# Patient Record
Sex: Female | Born: 1935
Health system: Southern US, Community
[De-identification: ages and names within clinical notes are randomized; demographics above are authoritative.]

## PROBLEM LIST (undated history)

## (undated) DIAGNOSIS — R51 Headache: Secondary | ICD-10-CM

## (undated) DIAGNOSIS — M1990 Unspecified osteoarthritis, unspecified site: Secondary | ICD-10-CM

## (undated) DIAGNOSIS — N189 Chronic kidney disease, unspecified: Secondary | ICD-10-CM

## (undated) DIAGNOSIS — I10 Essential (primary) hypertension: Secondary | ICD-10-CM

## (undated) DIAGNOSIS — M25473 Effusion, unspecified ankle: Secondary | ICD-10-CM

## (undated) DIAGNOSIS — E7800 Pure hypercholesterolemia, unspecified: Secondary | ICD-10-CM

## (undated) HISTORY — PX: ABDOMINAL HYSTERECTOMY: SHX81

## (undated) HISTORY — PX: EYE SURGERY: SHX253

## (undated) HISTORY — PX: OTHER SURGICAL HISTORY: SHX169

---

## 2006-01-17 HISTORY — PX: ANKLE SURGERY: SHX546

## 2006-05-30 ENCOUNTER — Encounter: Admission: RE | Admit: 2006-05-30 | Discharge: 2006-05-30 | Payer: Self-pay | Admitting: Family Medicine

## 2007-03-15 ENCOUNTER — Encounter: Payer: Self-pay | Admitting: Orthopedic Surgery

## 2007-03-15 ENCOUNTER — Inpatient Hospital Stay (HOSPITAL_COMMUNITY): Admission: EM | Admit: 2007-03-15 | Discharge: 2007-03-18 | Payer: Self-pay | Admitting: Emergency Medicine

## 2007-03-15 IMAGING — CR DG ANKLE PORT 2V*L*
2 series · 2 of 2 positions shown · non-contrast
Comparison: [DATE], [DATE] hours.

Left ankle, 2 views, [DATE], [B4] hours.
INDICATION: Postreduction, fracture.

[AP]
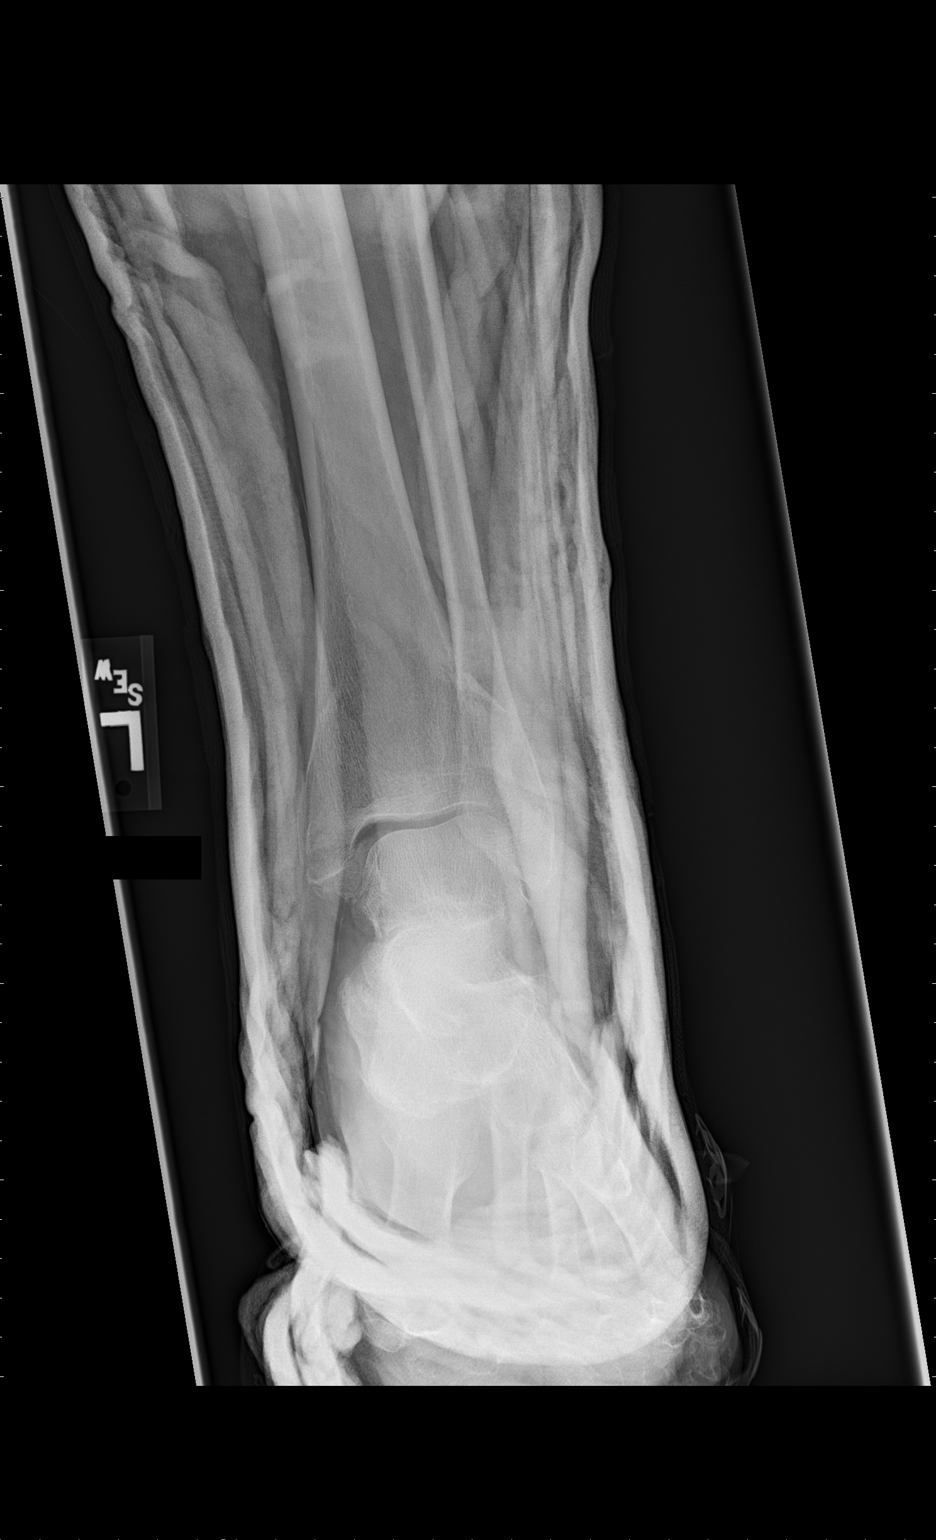

[ankle lat]
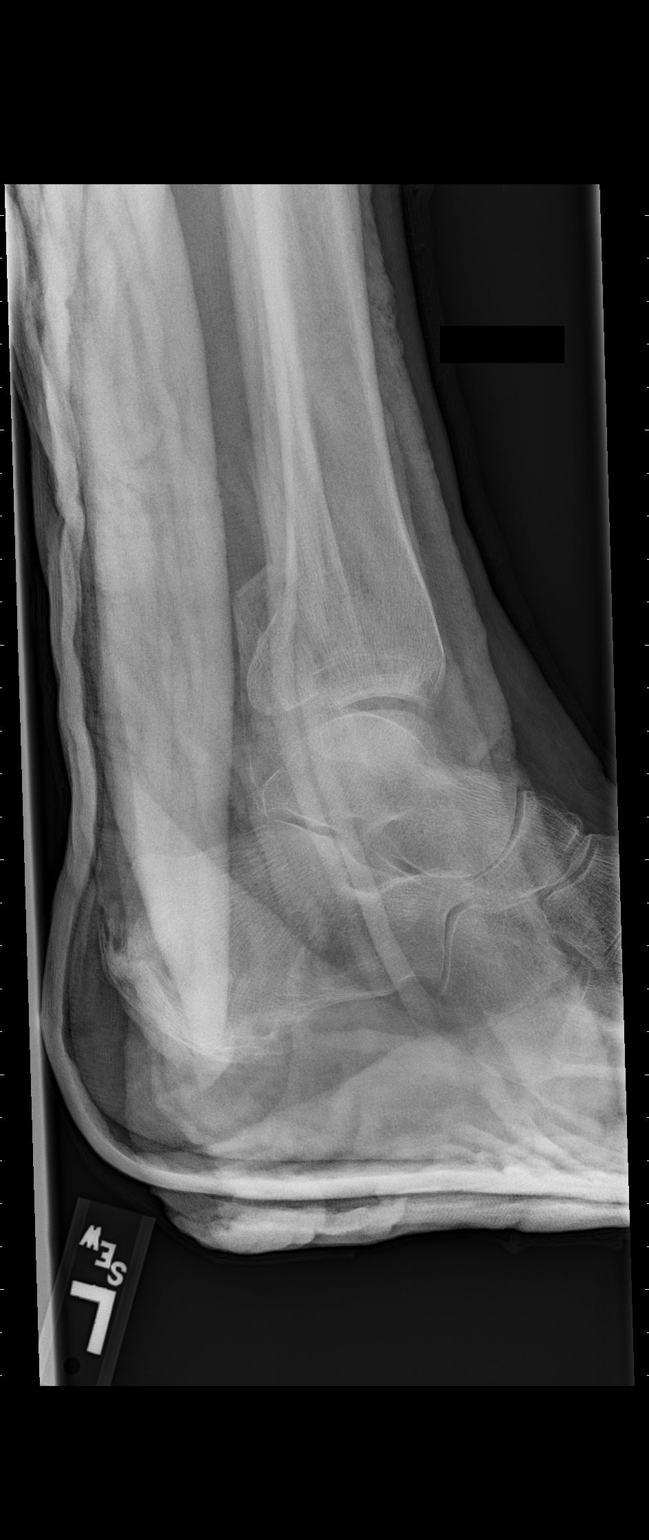

[2 of 2 positions shown; findings below may reference images not displayed]

FINDINGS: Splint has been applied to left ankle trimalleolar
fracture-dislocation. The ankle joint has been reduced, and alignment is
improved. Still one shaft width of posterior displacement of the lateral
malleolus. Cast material obscures bony detail.
IMPRESSION: 1. Reduction of trimalleolar fracture-dislocation.

## 2008-09-11 ENCOUNTER — Encounter (INDEPENDENT_AMBULATORY_CARE_PROVIDER_SITE_OTHER): Payer: Self-pay | Admitting: Obstetrics and Gynecology

## 2008-09-11 ENCOUNTER — Inpatient Hospital Stay (HOSPITAL_COMMUNITY): Admission: RE | Admit: 2008-09-11 | Discharge: 2008-09-13 | Payer: Self-pay | Admitting: Obstetrics and Gynecology

## 2010-04-24 LAB — URINALYSIS, ROUTINE W REFLEX MICROSCOPIC
Bilirubin Urine: NEGATIVE
Glucose, UA: NEGATIVE mg/dL
Ketones, ur: NEGATIVE mg/dL
pH: 6.5 (ref 5.0–8.0)

## 2010-04-24 LAB — CBC
Hemoglobin: 13.1 g/dL (ref 12.0–15.0)
Hemoglobin: 13.3 g/dL (ref 12.0–15.0)
MCHC: 34.8 g/dL (ref 30.0–36.0)
MCHC: 35.3 g/dL (ref 30.0–36.0)
MCV: 96.2 fL (ref 78.0–100.0)
RBC: 3.98 MIL/uL (ref 3.87–5.11)
RDW: 12 % (ref 11.5–15.5)
RDW: 12.1 % (ref 11.5–15.5)
WBC: 9.2 10*3/uL (ref 4.0–10.5)

## 2010-04-24 LAB — COMPREHENSIVE METABOLIC PANEL
ALT: 21 U/L (ref 0–35)
AST: 22 U/L (ref 0–37)
CO2: 30 mEq/L (ref 19–32)
Chloride: 102 mEq/L (ref 96–112)
Creatinine, Ser: 0.55 mg/dL (ref 0.4–1.2)
GFR calc Af Amer: >60 mL/min (ref >60)
GFR calc non Af Amer: >60 mL/min (ref >60)
Glucose, Bld: 119 mg/dL — ABNORMAL HIGH (ref 70–99)
Total Bilirubin: 0.5 mg/dL (ref 0.3–1.2)

## 2010-04-24 LAB — GLUCOSE, CAPILLARY
Glucose-Capillary: 118 mg/dL — ABNORMAL HIGH (ref 70–99)
Glucose-Capillary: 124 mg/dL — ABNORMAL HIGH (ref 70–99)
Glucose-Capillary: 141 mg/dL — ABNORMAL HIGH (ref 70–99)
Glucose-Capillary: 146 mg/dL — ABNORMAL HIGH (ref 70–99)

## 2010-04-24 LAB — APTT: aPTT: 28 seconds (ref 24–37)

## 2010-04-24 LAB — HEMOGLOBIN AND HEMATOCRIT, BLOOD: HCT: 40.1 % (ref 36.0–46.0)

## 2010-04-26 LAB — COMPREHENSIVE METABOLIC PANEL
ALT: 17 U/L (ref 0–35)
AST: 19 U/L (ref 0–37)
Albumin: 4.4 g/dL (ref 3.5–5.2)
CO2: 29 mEq/L (ref 19–32)
Calcium: 9.5 mg/dL (ref 8.4–10.5)
GFR calc Af Amer: >60 mL/min (ref >60)
GFR calc non Af Amer: >60 mL/min (ref >60)
Sodium: 135 mEq/L (ref 135–145)

## 2010-04-26 LAB — CBC
MCHC: 35.4 g/dL (ref 30.0–36.0)
RBC: 4.16 MIL/uL (ref 3.87–5.11)
WBC: 10.8 10*3/uL — ABNORMAL HIGH (ref 4.0–10.5)

## 2010-04-26 LAB — APTT: aPTT: 29 seconds (ref 24–37)

## 2010-04-26 LAB — URINALYSIS, ROUTINE W REFLEX MICROSCOPIC
Glucose, UA: NEGATIVE mg/dL
Ketones, ur: NEGATIVE mg/dL
Nitrite: NEGATIVE
Protein, ur: NEGATIVE mg/dL
pH: 7 (ref 5.0–8.0)

## 2010-04-26 LAB — URINE MICROSCOPIC-ADD ON

## 2010-06-01 NOTE — Op Note (Signed)
[name], BARBE NO.:  [id number]   MEDICAL RECORD NO.:  [id number]          PATIENT TYPE:  AMB   LOCATION:  DFTL                         FACILITY:  [facility]   PHYSICIAN:  Burnard Bunting, M.D.    DATE OF BIRTH:  Sep 30, 1934   DATE OF PROCEDURE:  03/15/2007  DATE OF DISCHARGE:                               OPERATIVE REPORT   PREOPERATIVE DIAGNOSES:  Left-ankle trimalleolar fracture.   POSTOPERATIVE DIAGNOSES:  Left-ankle trimalleolar fracture.   PROCEDURE:  Left ankle trimalleolar fracture open reduction and internal  fixation.   SURGEON:  Burnard Bunting, M.D.   ASSISTANT:  None.   ANESTHESIA:  General endotracheal.   ESTIMATED BLOOD LOSS:  50 mL.   DRAINS:  None.   INDICATIONS:  Erica Key is a 76 year old female with trimalleolar  ankle fracture on the left.  She presents now for operative management.  She is ambulatory.  She understands the risks and benefits.   PROCEDURE IN DETAIL:  Patient was brought to the operating room where  general endotracheal anesthesia was induced, preoperative antibiotic  administered.  The left leg was prepped and draped and positioned in a  sterile manner.  Collier Flowers was used to cover the operative field.  Ankle  Esmarch was utilized for approximately 60 minutes.   Lateral incision was made.  Skin and subcutaneous tissue were sharply  divided.  Care was taken to avoid an injury to the superficial peroneal  nerve.  Bone quality was poor.  Fracture was reduced with an alligator  clip.  Lag screw was placed.  Reasonable purchase was obtained.  Side  plate was applied and a combination of locking screws was applied, as  well as cortical screws.  Anatomic restoration of fibular length and  rotation was restored.  This incision was irrigated and packed with a  moist cloth.  Medial incision was then made over the fracture site.  Skin and subcutaneous tissue were sharply divided.  Care was taken to  avoid injury to the saphenous  vein.  Fracture was reduced under direct  visualization and held in position with a 4.0 cannulated screw, which  was checked in the AP and lateral planes under fluoroscopy.  At this  time, ankle Esmarch was released.  Bleeding points encountered were  cauterized with electrocautery.  Thorough irrigation was performed.  The  syndesmosis was stressed and the  syndesmosis was found to be stable.  All incisions were then closed,  using interrupted 2-0 Vicryl sutures, 3-0 Vicryl suture and 3-0 nylon.  Bulky dressing and posterior splint were applied.  The patient tolerated  the procedure well, without immediate complications.      Burnard Bunting, M.D.  Electronically Signed     GSD/MEDQ  D:  03/15/2007  T:  03/16/2007  Job:  [id number]

## 2010-06-01 NOTE — Consult Note (Signed)
NAMEKIMMARIE, PASCALE NO.:  [id number]   MEDICAL RECORD NO.:  [id number]          PATIENT TYPE:  EMS   LOCATION:  MAJO                         FACILITY:  [facility]   PHYSICIAN:  Burnard Bunting, M.D.    DATE OF BIRTH:  1934-06-18   DATE OF CONSULTATION:  03/15/2007  DATE OF DISCHARGE:                                 CONSULTATION   CHIEF COMPLAINT:  Left flank pain.   HISTORY OF PRESENT ILLNESS:  Erica Key is a 76 year old female who was  walking home from church when she missed a step and fell and injured her  left ankle.  She denies any loss of consciousness or any other  orthopedic complaints.  She is unable to bear weight.  She had a  deformity as well as significant pain upon arrival to the ER.   PAST MEDICAL HISTORY:  1. Hypertension.  2. Diabetes.   PAST SURGICAL HISTORY:  No prior surgeries.   SOCIAL HISTORY:  The patient does not smoke or drink.  No drug abuse.  She is retired.  She lives with her husband and family here in  [place].   ALLERGIES:  NO KNOWN DRUG ALLERGIES.   CURRENT MEDICATIONS:  1. Aspirin 81 mg p.o. daily.  2. Metoprolol 100 mg p.o. daily.  3. Diovan 1 p.o. daily.  4. Metformin 500 mg p.o. daily.  5. Clonidine 0.1 mg p.o. twice a day.   REVIEW OF SYSTEMS:  14 systems reviewed and negative.  No recent fever,  chills, chest pain, shortness of breath.   PHYSICAL EXAMINATION:  VITAL SIGNS:  Blood pressure currently 135/76,  heart rate 76, respiratory rate 20, temperature 98.  GENERAL:  She is really in no distress right now.  EXTREMITIES:  She has good cervical spine range of motion.  Pounding  radial pulses bilaterally.  Full range of motion of shoulders, elbows,  and wrists.  There are no masses, __________  , skin changes, or  lymphadenopathy noted in the upper extremity area.  Right lower  extremity demonstrates full range of motion of hip, ankle, and knees  with palpable pulse.  On the left, she has a splint on foot.  Her  toes  are profuse sensate and mobile.  Pedal pulses are palpable.  Arms are  soft.  Knee range of motion full.  ABDOMEN:  Benign.  CHEST:  Clear to auscultation.  NEUROLOGIC:  The patient is alert and oriented x3.   EKG shows normal sinus rhythm.  Chest x-ray with no airspace disease.  Ankle x-ray showed reduction of trimalleolar ankle fracture/dislocation.   LABORATORY DATA:  Creatinine 0.7, potassium 133.1, BUN 13, glucose 135.  White count 14.5, hemoglobin 13.8, platelets 291.   IMPRESSION:  Left ankle fracture/dislocation, now reduced, in a diabetic  patient.   PLAN:  The patient is ambulatory, and she will need open reduction and  internal fixation.  The risks and benefits were discussed with the  patient, including but not limited to, infection, nonunion, malunion,  deep venous thrombosis, death, as well as need for more surgery.  All  questions  were answered.      Burnard Bunting, M.D.  Electronically Signed     GSD/MEDQ  D:  03/15/2007  T:  03/15/2007  Job:  [id number]

## 2010-06-01 NOTE — Op Note (Signed)
NAMESILA, SARSFIELD NO.:  [id number]   MEDICAL RECORD NO.:  [id number]          PATIENT TYPE:  INP   LOCATION:  9319                          FACILITY:  [facility]   PHYSICIAN:  Miguel Aschoff, M.D.       DATE OF BIRTH:  Sep 29, 1934   DATE OF PROCEDURE:  09/11/2008  DATE OF DISCHARGE:                               OPERATIVE REPORT   PREOPERATIVE DIAGNOSIS:  Symptomatic pelvic relaxation with cystocele,  rectocele, and uterine descensus.   POSTOPERATIVE DIAGNOSIS:  Symptomatic pelvic relaxation with cystocele,  rectocele, and uterine descensus.   PROCEDURE:  Total vaginal hysterectomy, anterior posterior colporrhaphy.   SURGEON:  Miguel Aschoff, MD   ASSISTANT:  Luvenia Redden, MD   ANESTHESIA:  Spinal and epidural combined anesthesia.   COMPLICATIONS:  None.   JUSTIFICATION:  The patient is a 76 year old white female who is  complaining of pelvic pressure and pain and a vaginal bulge.  On  examination, she is noted to have a grade 3 cystocele, grade 2  rectocele, plus the uterine descensus.  Because of pressure and symptoms  associated with her pelvic relaxation and urinary stress incontinence,  she has requested that a definitive procedure be carried out to correct  these pelvic floor defects.  The risks and benefits of the procedure  were discussed with the patient.  Informed consent has been obtained.   PROCEDURE:  The patient was taken to the operating room, placed in a  sitting position, and a combination of the spinal and epidural  anesthetic was placed without difficulty.  After achieving satisfactory  level of anesthesia, the patient was placed in the dorsal lithotomy  position, prepped and draped in the usual sterile fashion.  Foley  catheter was inserted in the bladder.  At this point, examination again  revealed the grade 3 cystocele, grade 2 rectocele, and plus the uterine  descensus.  No other abnormalities were noted.  At this point, a  speculum was  placed in the vaginal vault.  The anterior cervical lip was  grasped with tenaculum.  The cervix was then injected with 1% Xylocaine  with epinephrine for hemostasis.  Then, the cervical mucosa was  circumscribed and dissected anteriorly and posteriorly.  Posteriorly,  there appeared to be some adhesive disease in the cul-de-sac, and the  dissection continued in anterior fashion staying in the uterus rather  than dissecting towards the rectum.  Anteriorly, the peritoneum was  entered without difficulty.  At this point, the uterosacral ligaments  were identified, clamped, cut, and suture ligated using suture ligatures  of 0 Vicryl.  The cardinal ligaments were clamped, cut, and suture  ligated in similar fashion using curved Heaney clamps.  Uterine vessels  were then found, clamped with curved Heaney clamps.  Pedicle cut and  suture ligated using suture ligatures of 0 Vicryl.  Then, additional  bites were taken of the broad ligament structures with curved Heaney  clamps.  Again, all pedicles were suture ligated using suture ligatures  of 0 Vicryl.  The glands appeared to be obliteration of cul-de-sac.  The  fundus was delivered.  The posterior surface of the uterus was then  identified, and with care the rectum was dissected off the posterior  surface of the lower uterine segment.  It appeared to be secondary to  endometriosis.  This was done with care to avoid any injury to the  rectum.  Once this was done, it was possible to remove the remaining  structures holding the uterus in place which was posterior and inferior  to the cervix and this was cut and ligated using ligature of 0 Vicryl.  Prior to this, the round ligaments and utero-ovarian ligaments were  clamped, cut, and suture ligated using suture ligatures of 0 Vicryl and  these were held.  At this point, the specimen was freed.  Inspection was  then made in the cul-de-sac and the remarkable finding appeared to be an  ovary that  again was adherent to the posterior cul-de-sac and lateral  pelvic sidewall by what appeared to be old scarred endometriosis.  At  this point, the posterior cuff was closed using running interlocking 0  Vicryl suture.  Then, lap counts were found to be correct and then the  peritoneum was closed using a pursestring suture of 0 Vicryl.  At this  point, attention was directed to the anterior vaginal wall.  Edges of  the vaginal mucosa were grasped with Allis clamps, and Allis clamp was  placed in midline below the urethra.  The anterior wall was then  injected with dilute 1% Xylocaine with epinephrine, and then dissection  was continued in the midline to approximately 2 cm of the urethral  orifice and then the vaginal mucosa was freed from the paravesical  fascia without difficulty.  Once this was done, because of mild  complaints of stress incontinence, urethra was elevated using a modified  Kelly plication using 2-0 Vicryl.  Once this was done, the large  cystocele was reduced using pursestring sutures of 2-0 Vicryl, three  such sutures were used.  Paravesical fascia was reapproximated in  midline using interrupted 2-0 Vicryl sutures.  The excess vaginal mucosa  was then trimmed and then the anterior walls were repaired by using  running interlocking 2-0 Vicryl suture incorporating the paravesical  fascia closing the dead space.  Once this was done, the remainder of the  vaginal cuff was closed using running continuous 2-0 Vicryl suture.  There was good hemostasis.  At this point, attention was directed to the  perineum.  The posterior vaginal wall was inspected.  Again, there was a  second-degree rectocele, was identified, grasped in the midline.  An  ellipse of mucosa was incised in the perineum, and dissection was  continued in the midline for approximately 6 cm.  At this point, the  pararectal fascia was dissected free from the vaginal mucosa.  The  rectocele was then reduced using  again 2 pursestring sutures of 2-0  Vicryl.  The pararectal fascia was reapproximated in the midline using  interrupted 0 Vicryl sutures.  At this point, the excess vaginal mucosa  was trimmed and vaginal mucosa was reapproximated using running  interlocking 2-0 Vicryl suture again incorporating some of the  pararectal fascia to close the dead space.  Perineal body was then  reconstituted using a crown suture of 0 Vicryl and then the perineum was  closed using subcutaneous 2-0 Vicryl running sutures, and then the  perineal skin was reapproximated using subcuticular 2-0 Vicryl sutures.  Final inspection was made for hemostasis.  Hemostasis appeared to  be  excellent.  An Iodoform pack was placed in the vagina and the procedure  was completed.  The estimated blood loss was approximately 100 mL.  The  patient tolerated the procedure well.  Her urine remained clear  throughout the procedure.  The patient was taken to recovery in  satisfactory condition.      Miguel Aschoff, M.D.  Electronically Signed     AR/MEDQ  D:  09/11/2008  T:  09/11/2008  Job:  [id number]

## 2010-06-04 NOTE — Discharge Summary (Signed)
NAMEBECKIE, VISCARDI NO.:  [id number]   MEDICAL RECORD NO.:  [id number]          PATIENT TYPE:  INP   LOCATION:  5013                         FACILITY:  [facility]   PHYSICIAN:  Burnard Bunting, M.D.    DATE OF BIRTH:  1934-10-24   DATE OF ADMISSION:  03/14/2007  DATE OF DISCHARGE:  03/18/2007                               DISCHARGE SUMMARY   DISCHARGE DIAGNOSIS:  Left ankle fracture.   SECONDARY DIAGNOSES:  1. Hypertension.  2. Diabetes.   OPERATIONS AND PROCEDURES:  Left ankle fracture open reduction and  internal fixation performed on March 15, 2007.   HOSPITAL COURSE:  Erica Key is an ambulatory 76 year old female who  sustained left ankle fracture and dislocation on March 15, 2007.  She  underwent open reduction and internal fixation of the fracture at that  time.  She tolerated the procedure well without any complications, foot  was perfused well and sensate on postoperative day #1.  She was started  on Coumadin for DVT prophylaxis and therapy for mobilization,  nonweightbearing on the ankle which was splinted.  The patient has had  otherwise unremarkable recovery.  She is discharged home in good  condition on March 18, 2007.  Nonweightbearing on the left ankle.   DISCHARGE MEDICATIONS:  1. Coumadin 5 mg p.o. daily with INR of 2 to 2.5.  2. Metoprolol 100 mg p.o. daily.  3. Diovan 1 p.o. daily.  4. Metformin 500 mg p.o. daily.  5. Clonidine 0.1 mg p.o. b.i.d.  6. Percocet 1-2 p.o. q.6 h. p.r.n. pain.  7. Robaxin 500 mg p.o. q.8 h. p.r.n. muscle spasm.   She will follow up with me within the 7 days for splint removal.  She  was discharged home in good condition.      Burnard Bunting, M.D.  Electronically Signed     GSD/MEDQ  D:  04/24/2007  T:  04/25/2007  Job:  [id number]

## 2010-06-04 NOTE — Discharge Summary (Signed)
Erica Key, Erica Key NO.:  [id number]   MEDICAL RECORD NO.:  [id number]          PATIENT TYPE:  INP   LOCATION:  9319                          FACILITY:  [facility]   PHYSICIAN:  Miguel Aschoff, M.D.       DATE OF BIRTH:  September 27, 1934   DATE OF ADMISSION:  09/11/2008  DATE OF DISCHARGE:  09/13/2008                               DISCHARGE SUMMARY   ADMISSION DIAGNOSES:  Symptomatic pelvic relaxation with cystocele,  rectocele, uterine descensus, urinary stress incontinence.   OPERATIONS AND PROCEDURES:  Total vaginal hysterectomy with anterior-  posterior colporrhaphy.  General anesthesia.   BRIEF HISTORY:  The patient is a 76 year old white female who presented  to the office for evaluation of lower abdominal pelvic pressure and pain  and noticing something protruding from the vagina.  On examination, the  patient was noted have grade 3 cystocele, grade 2 uterine descensus, and  grade 2 rectocele.  Because of the symptomatology associated with these  pelvic floor hernias, the patient had requested that a procedure be  carried out in order to correct these defects.  After receiving medical  clearance, the patient was admitted to the hospital on September 11, 2008  to undergo total vaginal hysterectomy and anterior and posterior  colporrhaphy.  Preoperative studies were obtained.  This include  admission hemoglobin of 13.3, white count of 9200.  PT and PTT were  within normal limits.  Chemistry profile was essentially within normal  limits.   HOSPITAL COURSE:  Under epidural and spinal anesthesia on September 11, 2008, a total vaginal hysterectomy, anterior and posterior colporrhaphy  were carried out.  The only significant finding other than these pelvic  floor defects was obliteration of cul-de-sac felt secondary to  endometriosis.  No other findings were of significance during this  surgery and this was dealt with via the surgical procedure be carefully  taking the uterine  fundus and dissecting the adhesions off the posterior  surface of the uterus with care to avoid any injury to the rectum.  The  procedure was carried out without difficulty.  The patient had an  essentially uneventful postoperative course.  The only remarkable  finding was initial nausea on the first postoperative day.  Her  hemoglobin remained stable in the 12 range.  The patient remained  afebrile.  After controlling her nausea with antiemetic therapy and IV  fluids, by the second postoperative day, the Foley catheter was removed,  and the patient was ambulating well and tolerating regular diet, and  felt to be in an condition to go home.  Medications for home include  Tylox one every 3 hours as needed for pain.  She was instructed to begin  all her other postoperative medications.  Additional instructions were  to place nothing in the vagina, to call if there are any problems such  as fever, pain, or heavy bleeding.  The final pathology report on  hysterectomy specimen revealed a 69 g uterus.  There was chronic  cervicitis and active endometrium.  Leiomyomas were found, the benign  portion of the ovary  noted that was removed at the time of dealing with  the cul-de-sac obliteration.  The patient was sent home on a regular  diet.  Plan is for the patient to be seen back in 4 weeks for followup  examination.  She understands to call for any problems such as fever,  pain, or heavy bleeding.      Miguel Aschoff, M.D.  Electronically Signed    AR/MEDQ  D:  09/16/2008  T:  09/17/2008  Job:  [id number]

## 2010-10-08 LAB — I-STAT 8, (EC8 V) (CONVERTED LAB)
Acid-Base Excess: 3 — ABNORMAL HIGH
BUN: 13
Bicarbonate: 29.4 — ABNORMAL HIGH
Chloride: 99
Glucose, Bld: 135 — ABNORMAL HIGH
HCT: 43
Hemoglobin: 14.6
Operator id: 133351
Potassium: 4.1
Sodium: 133 — ABNORMAL LOW
TCO2: 31
pCO2, Ven: 49.4
pH, Ven: 7.382 — ABNORMAL HIGH

## 2010-10-08 LAB — DIFFERENTIAL
Basophils Absolute: 0
Eosinophils Relative: 1
Lymphocytes Relative: 25
Lymphs Abs: 3.6
Monocytes Absolute: 0.5
Monocytes Relative: 4

## 2010-10-08 LAB — PROTIME-INR
INR: 1
Prothrombin Time: 14.6

## 2010-10-08 LAB — CBC
MCV: 93
RBC: 4.26
RDW: 12.4
WBC: 14.5 — ABNORMAL HIGH

## 2010-10-08 LAB — POCT I-STAT CREATININE
Creatinine, Ser: 0.7
Operator id: 133351

## 2012-03-14 ENCOUNTER — Other Ambulatory Visit: Payer: Self-pay | Admitting: Gastroenterology

## 2012-04-03 ENCOUNTER — Encounter (HOSPITAL_COMMUNITY): Payer: Self-pay | Admitting: *Deleted

## 2012-04-05 ENCOUNTER — Encounter (HOSPITAL_COMMUNITY): Payer: Self-pay | Admitting: Pharmacy Technician

## 2012-04-10 ENCOUNTER — Ambulatory Visit (HOSPITAL_COMMUNITY)
Admission: RE | Admit: 2012-04-10 | Discharge: 2012-04-10 | Disposition: A | Discharge status: Home / Self Care | Payer: Medicare Other | Source: Ambulatory Visit | Attending: Gastroenterology | Admitting: Gastroenterology

## 2012-04-10 ENCOUNTER — Encounter (HOSPITAL_COMMUNITY)
Admission: RE | Disposition: A | Discharge status: Home / Self Care | Payer: Self-pay | Source: Ambulatory Visit | Attending: Gastroenterology

## 2012-04-10 ENCOUNTER — Encounter (HOSPITAL_COMMUNITY): Payer: Self-pay | Admitting: Anesthesiology

## 2012-04-10 ENCOUNTER — Ambulatory Visit (HOSPITAL_COMMUNITY): Payer: Medicare Other | Admitting: Anesthesiology

## 2012-04-10 ENCOUNTER — Encounter (HOSPITAL_COMMUNITY): Payer: Self-pay | Admitting: *Deleted

## 2012-04-10 DIAGNOSIS — Z1211 Encounter for screening for malignant neoplasm of colon: Secondary | ICD-10-CM | POA: Insufficient documentation

## 2012-04-10 DIAGNOSIS — K5730 Diverticulosis of large intestine without perforation or abscess without bleeding: Secondary | ICD-10-CM | POA: Insufficient documentation

## 2012-04-10 DIAGNOSIS — E119 Type 2 diabetes mellitus without complications: Secondary | ICD-10-CM | POA: Insufficient documentation

## 2012-04-10 DIAGNOSIS — I10 Essential (primary) hypertension: Secondary | ICD-10-CM | POA: Insufficient documentation

## 2012-04-10 DIAGNOSIS — E781 Pure hyperglyceridemia: Secondary | ICD-10-CM | POA: Insufficient documentation

## 2012-04-10 DIAGNOSIS — Z79899 Other long term (current) drug therapy: Secondary | ICD-10-CM | POA: Insufficient documentation

## 2012-04-10 DIAGNOSIS — Z90710 Acquired absence of both cervix and uterus: Secondary | ICD-10-CM | POA: Insufficient documentation

## 2012-04-10 DIAGNOSIS — Z800 Family history of malignant neoplasm of digestive organs: Secondary | ICD-10-CM | POA: Insufficient documentation

## 2012-04-10 HISTORY — DX: Chronic kidney disease, unspecified: N18.9

## 2012-04-10 HISTORY — DX: Pure hypercholesterolemia, unspecified: E78.00

## 2012-04-10 HISTORY — DX: Headache: R51

## 2012-04-10 HISTORY — PX: COLONOSCOPY WITH PROPOFOL: SHX5780

## 2012-04-10 HISTORY — DX: Effusion, unspecified ankle: M25.473

## 2012-04-10 HISTORY — DX: Unspecified osteoarthritis, unspecified site: M19.90

## 2012-04-10 HISTORY — DX: Essential (primary) hypertension: I10

## 2012-04-10 LAB — POCT I-STAT 4, (NA,K, GLUC, HGB,HCT)
Glucose, Bld: 95 mg/dL (ref 70–99)
HCT: 35 % — ABNORMAL LOW (ref 36.0–46.0)
Potassium: 4 mEq/L (ref 3.5–5.1)
Sodium: 140 mEq/L (ref 135–145)

## 2012-04-10 SURGERY — COLONOSCOPY WITH PROPOFOL
Anesthesia: Monitor Anesthesia Care

## 2012-04-10 MED ORDER — PROPOFOL 10 MG/ML IV EMUL
INTRAVENOUS | Status: DC | PRN
  Administered 2012-04-10: 100 ug/kg/min via INTRAVENOUS

## 2012-04-10 MED ORDER — MIDAZOLAM HCL 5 MG/5ML IJ SOLN
INTRAMUSCULAR | Status: DC | PRN
  Administered 2012-04-10 (×2): 1 mg via INTRAVENOUS

## 2012-04-10 MED ORDER — LACTATED RINGERS IV SOLN
INTRAVENOUS | Status: DC
  Administered 2012-04-10 (×2): via INTRAVENOUS

## 2012-04-10 MED ORDER — KETAMINE HCL 10 MG/ML IJ SOLN
INTRAMUSCULAR | Status: DC | PRN
  Administered 2012-04-10 (×4): 10 mg via INTRAVENOUS

## 2012-04-10 MED ORDER — SODIUM CHLORIDE 0.9 % IV SOLN: INTRAVENOUS | Status: DC

## 2012-04-10 SURGICAL SUPPLY — 21 items

## 2012-04-10 NOTE — Transfer of Care (Signed)
Immediate Anesthesia Transfer of Care Note  Patient: Erica Key  Procedure(s) Performed: Procedure(s): COLONOSCOPY WITH PROPOFOL (N/A)  Patient Location: PACU and Endoscopy Unit  Anesthesia Type:MAC  Level of Consciousness: awake, alert  and patient cooperative  Airway & Oxygen Therapy: Patient Spontanous Breathing and Patient connected to face mask oxygen  Post-op Assessment: Report given to PACU RN and Post -op Vital signs reviewed and stable  Post vital signs: Reviewed and stable  Complications: No apparent anesthesia complications

## 2012-04-10 NOTE — Op Note (Signed)
Procedure: Baseline screening colonoscopy  Endoscopist: Danise Edge  Premedication: Propofol administered by anesthesia  Procedure: The patient was placed in the left lateral decubitus position. Anal inspection and digital rectal exam were normal. The Pentax pediatric colonoscope was introduced into the rectum and advanced to the cecum as identified by a normal-appearing ileocecal valve and appendiceal orifice. Colonic preparation for the exam today was good.  Rectum. Normal. Retroflex view of the distal rectum normal.  Sigmoid colon and descending colon. Left colonic diverticulosis.  Splenic flexure. Normal.  Transverse colon. Normal.  Hepatic flexure. Normal.  Ascending colon. Normal.  Cecum and ileocecal valve. Normal.  Assessment: Normal screening proctocolonoscopy to the cecum.  Recommendations: Consider a repeat screening colonoscopy in 5 years if her health allows. Two sisters were diagnosed with colon cancer.

## 2012-04-10 NOTE — Anesthesia Preprocedure Evaluation (Signed)
Anesthesia Evaluation  Patient identified by MRN, date of birth, ID band Patient awake    Reviewed: Allergy & Precautions, H&P , NPO status , Patient's Chart, lab work & pertinent test results, reviewed documented beta blocker date and time   Airway Mallampati: II TM Distance: >3 FB Neck ROM: full    Dental   Pulmonary neg pulmonary ROS,  breath sounds clear to auscultation        Cardiovascular hypertension, On Medications and On Home Beta Blockers Rhythm:regular     Neuro/Psych  Headaches, negative psych ROS   GI/Hepatic negative GI ROS, Neg liver ROS,   Endo/Other  negative endocrine ROS  Renal/GU Renal disease  negative genitourinary   Musculoskeletal   Abdominal   Peds  Hematology negative hematology ROS (+)   Anesthesia Other Findings See surgeon's H&P   Reproductive/Obstetrics negative OB ROS                           Anesthesia Physical Anesthesia Plan  ASA: II  Anesthesia Plan: MAC   Post-op Pain Management:    Induction:   Airway Management Planned: Simple Face Mask  Additional Equipment:   Intra-op Plan:   Post-operative Plan:   Informed Consent: I have reviewed the patients History and Physical, chart, labs and discussed the procedure including the risks, benefits and alternatives for the proposed anesthesia with the patient or authorized representative who has indicated his/her understanding and acceptance.   Dental Advisory Given  Plan Discussed with: CRNA and Surgeon  Anesthesia Plan Comments:         Anesthesia Quick Evaluation

## 2012-04-10 NOTE — Anesthesia Postprocedure Evaluation (Signed)
Anesthesia Post Note  Patient: Erica Key  Procedure(s) Performed: Procedure(s) (LRB): COLONOSCOPY WITH PROPOFOL (N/A)  Anesthesia type: MAC  Patient location: PACU  Post pain: Pain level controlled  Post assessment: Post-op Vital signs reviewed  Last Vitals: BP 141/70  Pulse 55  Temp(Src) 36.7 C  Resp 14  Ht 5\' 7"  (1.702 m)  Wt 158 lb (71.668 kg)  BMI 24.74 kg/m2  SpO2 100%  Post vital signs: Reviewed  Level of consciousness: awake  Complications: No apparent anesthesia complications

## 2012-04-10 NOTE — H&P (Signed)
Procedure: Screening colonoscopy  History: The patient is a 78 year old female born 11-28-1934. The patient is scheduled to undergo her first screening colonoscopy with polypectomy to prevent colon cancer. She has 2 sisters diagnosed with colon cancer.  Chronic medications: Lisinopril. Amlodipine. Metoprolol. Simvastatin. Aspirin. Fish oil. Calcium. Vitamin D.  Past medical and surgical history: Hypertension hypertriglyceridemia. Cholelithiasis. Type 2 diabetes mellitus. Left ankle fracture surgery. Hysterectomy. Bladder tacking surgery. Bilateral cataract surgery.  Family history: 2 sisters diagnosed with colon cancer.  Habits: The patient is never smoked cigarettes. She does not consume alcohol.  Allergies: None.  Plan: Proceed with screening colonoscopy with polypectomy to prevent colon cancer using propofol sedation.

## 2012-04-11 ENCOUNTER — Encounter (HOSPITAL_COMMUNITY): Payer: Self-pay | Admitting: Gastroenterology

## 2015-02-11 DIAGNOSIS — I10 Essential (primary) hypertension: Secondary | ICD-10-CM | POA: Diagnosis not present

## 2015-02-11 DIAGNOSIS — Z1389 Encounter for screening for other disorder: Secondary | ICD-10-CM | POA: Diagnosis not present

## 2015-02-11 DIAGNOSIS — E781 Pure hyperglyceridemia: Secondary | ICD-10-CM | POA: Diagnosis not present

## 2016-02-12 DIAGNOSIS — M810 Age-related osteoporosis without current pathological fracture: Secondary | ICD-10-CM | POA: Diagnosis not present

## 2016-02-12 DIAGNOSIS — I10 Essential (primary) hypertension: Secondary | ICD-10-CM | POA: Diagnosis not present

## 2016-02-12 DIAGNOSIS — Z0000 Encounter for general adult medical examination without abnormal findings: Secondary | ICD-10-CM | POA: Diagnosis not present

## 2016-02-12 DIAGNOSIS — Z1389 Encounter for screening for other disorder: Secondary | ICD-10-CM | POA: Diagnosis not present

## 2016-02-12 DIAGNOSIS — E781 Pure hyperglyceridemia: Secondary | ICD-10-CM | POA: Diagnosis not present

## 2016-05-30 DIAGNOSIS — J019 Acute sinusitis, unspecified: Secondary | ICD-10-CM | POA: Diagnosis not present

## 2016-08-02 DIAGNOSIS — Z961 Presence of intraocular lens: Secondary | ICD-10-CM | POA: Diagnosis not present

## 2016-08-02 DIAGNOSIS — H40013 Open angle with borderline findings, low risk, bilateral: Secondary | ICD-10-CM | POA: Diagnosis not present

## 2016-08-02 DIAGNOSIS — H26491 Other secondary cataract, right eye: Secondary | ICD-10-CM | POA: Diagnosis not present

## 2017-02-16 DIAGNOSIS — E7800 Pure hypercholesterolemia, unspecified: Secondary | ICD-10-CM | POA: Diagnosis not present

## 2017-02-16 DIAGNOSIS — Z0000 Encounter for general adult medical examination without abnormal findings: Secondary | ICD-10-CM | POA: Diagnosis not present

## 2017-02-16 DIAGNOSIS — M810 Age-related osteoporosis without current pathological fracture: Secondary | ICD-10-CM | POA: Diagnosis not present

## 2017-02-16 DIAGNOSIS — Z1389 Encounter for screening for other disorder: Secondary | ICD-10-CM | POA: Diagnosis not present

## 2017-02-16 DIAGNOSIS — I10 Essential (primary) hypertension: Secondary | ICD-10-CM | POA: Diagnosis not present

## 2017-08-24 DIAGNOSIS — H26493 Other secondary cataract, bilateral: Secondary | ICD-10-CM | POA: Diagnosis not present

## 2017-08-24 DIAGNOSIS — D3131 Benign neoplasm of right choroid: Secondary | ICD-10-CM | POA: Diagnosis not present

## 2017-08-24 DIAGNOSIS — H04123 Dry eye syndrome of bilateral lacrimal glands: Secondary | ICD-10-CM | POA: Diagnosis not present

## 2017-08-24 DIAGNOSIS — Z961 Presence of intraocular lens: Secondary | ICD-10-CM | POA: Diagnosis not present

## 2018-02-20 ENCOUNTER — Other Ambulatory Visit: Payer: Self-pay | Admitting: Internal Medicine

## 2018-02-20 DIAGNOSIS — I10 Essential (primary) hypertension: Secondary | ICD-10-CM | POA: Diagnosis not present

## 2018-02-20 DIAGNOSIS — E7800 Pure hypercholesterolemia, unspecified: Secondary | ICD-10-CM | POA: Diagnosis not present

## 2018-02-20 DIAGNOSIS — Z1389 Encounter for screening for other disorder: Secondary | ICD-10-CM | POA: Diagnosis not present

## 2018-02-20 DIAGNOSIS — M810 Age-related osteoporosis without current pathological fracture: Secondary | ICD-10-CM

## 2018-02-20 DIAGNOSIS — Z0000 Encounter for general adult medical examination without abnormal findings: Secondary | ICD-10-CM | POA: Diagnosis not present

## 2018-04-18 ENCOUNTER — Other Ambulatory Visit: Payer: Self-pay

## 2018-07-11 ENCOUNTER — Other Ambulatory Visit: Payer: Self-pay

## 2019-02-02 ENCOUNTER — Ambulatory Visit: Payer: Medicare Other | Attending: Internal Medicine

## 2019-02-02 DIAGNOSIS — Z23 Encounter for immunization: Secondary | ICD-10-CM | POA: Insufficient documentation

## 2019-02-02 NOTE — Progress Notes (Signed)
   Covid-19 Vaccination Clinic  Name:  Erica Key    MRN: [id number] DOB: 1934-10-24  02/02/2019  [name] was observed post Covid-19 immunization for 15 minutes without incidence. She was provided with Vaccine Information Sheet and instruction to access the V-Safe system.   [name] was instructed to call 911 with any severe reactions post vaccine: Marland Kitchen Difficulty breathing  . Swelling of your face and throat  . A fast heartbeat  . A bad rash all over your body  . Dizziness and weakness    Immunizations Administered    Name Date Dose VIS Date Route   Pfizer COVID-19 Vaccine 02/02/2019 10:46 AM 0.3 mL 12/28/2018 Intramuscular   Manufacturer: [employer], [employer]   Lot: [id number]   [place]: [id number]

## 2019-02-21 ENCOUNTER — Ambulatory Visit: Payer: Medicare Other | Attending: Internal Medicine

## 2019-02-21 DIAGNOSIS — Z23 Encounter for immunization: Secondary | ICD-10-CM | POA: Insufficient documentation

## 2019-02-21 NOTE — Progress Notes (Signed)
   Covid-19 Vaccination Clinic  Name:  Erica Key    MRN: [id number] DOB: 10-Sep-1934  02/21/2019  [name] was observed post Covid-19 immunization for 15 minutes without incidence. She was provided with Vaccine Information Sheet and instruction to access the V-Safe system.   [name] was instructed to call 911 with any severe reactions post vaccine: Marland Kitchen Difficulty breathing  . Swelling of your face and throat  . A fast heartbeat  . A bad rash all over your body  . Dizziness and weakness    Immunizations Administered    Name Date Dose VIS Date Route   Pfizer COVID-19 Vaccine 02/21/2019  2:20 PM 0.3 mL 12/28/2018 Intramuscular   Manufacturer: [place]   Lot: [id number]   [place]: [id number]

## 2019-04-30 DIAGNOSIS — Z1389 Encounter for screening for other disorder: Secondary | ICD-10-CM | POA: Diagnosis not present

## 2019-04-30 DIAGNOSIS — I10 Essential (primary) hypertension: Secondary | ICD-10-CM | POA: Diagnosis not present

## 2019-04-30 DIAGNOSIS — M79671 Pain in right foot: Secondary | ICD-10-CM | POA: Diagnosis not present

## 2019-04-30 DIAGNOSIS — Z0000 Encounter for general adult medical examination without abnormal findings: Secondary | ICD-10-CM | POA: Diagnosis not present

## 2019-04-30 DIAGNOSIS — Z8639 Personal history of other endocrine, nutritional and metabolic disease: Secondary | ICD-10-CM | POA: Diagnosis not present

## 2019-04-30 DIAGNOSIS — M810 Age-related osteoporosis without current pathological fracture: Secondary | ICD-10-CM | POA: Diagnosis not present

## 2019-04-30 DIAGNOSIS — E7800 Pure hypercholesterolemia, unspecified: Secondary | ICD-10-CM | POA: Diagnosis not present

## 2019-05-03 ENCOUNTER — Other Ambulatory Visit: Payer: Self-pay | Admitting: Internal Medicine

## 2019-05-03 DIAGNOSIS — M810 Age-related osteoporosis without current pathological fracture: Secondary | ICD-10-CM

## 2019-05-06 DIAGNOSIS — H04123 Dry eye syndrome of bilateral lacrimal glands: Secondary | ICD-10-CM | POA: Diagnosis not present

## 2019-05-06 DIAGNOSIS — H43813 Vitreous degeneration, bilateral: Secondary | ICD-10-CM | POA: Diagnosis not present

## 2019-05-06 DIAGNOSIS — Z961 Presence of intraocular lens: Secondary | ICD-10-CM | POA: Diagnosis not present

## 2019-05-06 DIAGNOSIS — D3131 Benign neoplasm of right choroid: Secondary | ICD-10-CM | POA: Diagnosis not present

## 2019-05-06 DIAGNOSIS — H26493 Other secondary cataract, bilateral: Secondary | ICD-10-CM | POA: Diagnosis not present

## 2019-05-14 DIAGNOSIS — H26492 Other secondary cataract, left eye: Secondary | ICD-10-CM | POA: Diagnosis not present

## 2019-05-31 DIAGNOSIS — M722 Plantar fascial fibromatosis: Secondary | ICD-10-CM | POA: Diagnosis not present

## 2019-05-31 DIAGNOSIS — I10 Essential (primary) hypertension: Secondary | ICD-10-CM | POA: Diagnosis not present

## 2019-06-27 ENCOUNTER — Ambulatory Visit (INDEPENDENT_AMBULATORY_CARE_PROVIDER_SITE_OTHER): Payer: PPO

## 2019-06-27 ENCOUNTER — Encounter: Payer: Self-pay | Admitting: Podiatry

## 2019-06-27 ENCOUNTER — Other Ambulatory Visit: Payer: Self-pay

## 2019-06-27 ENCOUNTER — Ambulatory Visit: Payer: PPO | Admitting: Podiatry

## 2019-06-27 ENCOUNTER — Other Ambulatory Visit: Payer: Self-pay | Admitting: Podiatry

## 2019-06-27 VITALS — BP 152/74 | HR 54

## 2019-06-27 DIAGNOSIS — M7750 Other enthesopathy of unspecified foot: Secondary | ICD-10-CM

## 2019-06-27 DIAGNOSIS — M778 Other enthesopathies, not elsewhere classified: Secondary | ICD-10-CM

## 2019-06-27 DIAGNOSIS — M7751 Other enthesopathy of right foot: Secondary | ICD-10-CM | POA: Diagnosis not present

## 2019-06-27 DIAGNOSIS — M722 Plantar fascial fibromatosis: Secondary | ICD-10-CM

## 2019-06-27 MED ORDER — DICLOFENAC SODIUM 1 % EX GEL: 2.0000 g | Freq: Four times a day (QID) | CUTANEOUS | 2 refills | Status: DC

## 2019-06-27 NOTE — Patient Instructions (Signed)

## 2019-06-28 ENCOUNTER — Telehealth: Payer: Self-pay | Admitting: *Deleted

## 2019-06-28 MED ORDER — DICLOFENAC SODIUM 1 % EX GEL: 2.0000 g | Freq: Four times a day (QID) | CUTANEOUS | 2 refills | Status: AC

## 2019-06-28 NOTE — Telephone Encounter (Signed)
Erica Key w/ [facility] is wanting clarification on the number of tubes (100 gram a tube) to order, should it be prn and that there are 2 sets of directions on prescription, which one did you mean to send.Please resend order, may leave a detail  message w/ directions. REf# [id number]

## 2019-06-28 NOTE — Telephone Encounter (Signed)
Pharmacist is wanting to clarify prescription for Diclofonac Sodium gel 1%

## 2019-07-01 ENCOUNTER — Telehealth: Payer: Self-pay | Admitting: *Deleted

## 2019-07-01 NOTE — Telephone Encounter (Signed)
Ammie- after we spoke Friday were you able to take care of this. Just wanted to follow up. Thanks!

## 2019-07-01 NOTE — Telephone Encounter (Signed)
Answered in 07/01/2019 Telephone Call.

## 2019-07-01 NOTE — Telephone Encounter (Signed)
Yes, I did call them back on Friday but they said that the order should be resent ,needed more clarifications, also sent to [facility].

## 2019-07-01 NOTE — Telephone Encounter (Signed)
Thanks. Can you please resend the prescription if needed.

## 2019-07-01 NOTE — Progress Notes (Signed)
Subjective:   Patient ID: Erica Key, female   DOB: 85 y.o.   MRN: [id number]   HPI 85 year old female presents the office today for concerns of pain to the right heel pointing the Achilles tendon.  She denies any recent injury or trauma medicine on the last couple of months.  She has intermittent swelling.  No rating pain.  Also has an occasional discomfort to the left first MPJ.  She previous had surgery approximately 12 years ago on the left ankle.  No injury to the left foot.  She cannot tolerate oral anti-inflammatories.  Review of Systems  All other systems reviewed and are negative.  Past Medical History:  Diagnosis Date  . Arthritis    Right hand  . Chronic kidney disease    Rt. kidney partial blockage  . Headache(784.0)    occ.  . High cholesterol   . Hypertension   . Swelling of ankle     Past Surgical History:  Procedure Laterality Date  . ABDOMINAL HYSTERECTOMY    . ANKLE SURGERY  2008  . Bladder Tack    . COLONOSCOPY WITH PROPOFOL N/A 04/10/2012   Procedure: COLONOSCOPY WITH PROPOFOL;  Surgeon: Garlan Fair, MD;  Location: [facility];  Service: Endoscopy;  Laterality: N/A;  . EYE SURGERY     Bilateral catarct extraction     Current Outpatient Medications:  .  acetaminophen (TYLENOL) 500 MG tablet, Take 1,000 mg by mouth at bedtime., Disp: , Rfl:  .  amLODipine (NORVASC) 10 MG tablet, Take 10 mg by mouth at bedtime., Disp: , Rfl:  .  aspirin EC 81 MG tablet, Take 81 mg by mouth every morning., Disp: , Rfl:  .  Calcium Carbonate-Vitamin D (CALTRATE 600+D PO), Take 1 tablet by mouth 2 (two) times daily., Disp: , Rfl:  .  Cholecalciferol (VITAMIN D3) 2000 UNITS TABS, Take 2,000 Units by mouth every morning., Disp: , Rfl:  .  Cinnamon 500 MG TABS, Take 500 mg by mouth 2 (two) times daily., Disp: , Rfl:  .  Cyanocobalamin (VITAMIN B 12 PO), Take 1 tablet by mouth every morning., Disp: , Rfl:  .  diclofenac Sodium (VOLTAREN) 1 % GEL, Apply 2 g topically 4  (four) times daily., Disp: 100 g, Rfl: 2 .  diphenhydrAMINE (BENADRYL) 25 MG tablet, Take 50 mg by mouth at bedtime., Disp: , Rfl:  .  Glucosamine Sulfate ([employer]S GLUCOSAMINE SULFATE) 1000 MG CAPS, Take 1,000 mg by mouth 2 (two) times daily., Disp: , Rfl:  .  IRON PO, Take 1 tablet by mouth every morning., Disp: , Rfl:  .  lisinopril (PRINIVIL,ZESTRIL) 20 MG tablet, Take 20 mg by mouth every morning., Disp: , Rfl:  .  metoprolol (LOPRESSOR) 50 MG tablet, Take 50 mg by mouth 2 (two) times daily., Disp: , Rfl:  .  Multiple Vitamin (MULTIVITAMIN WITH MINERALS) TABS, Take 1 tablet by mouth every morning., Disp: , Rfl:  .  OVER THE COUNTER MEDICATION, Take 1,000 mg by mouth 2 (two) times daily. Tumeric 500mg ., Disp: , Rfl:  .  simvastatin (ZOCOR) 40 MG tablet, Take 40 mg by mouth at bedtime., Disp: , Rfl:   No Known Allergies       Objective:  Physical Exam  General: AAO x3, NAD  Dermatological: Skin is warm, dry and supple bilateral. Nails x 10 are well manicured; remaining integument appears unremarkable at this time. There are no open sores, no preulcerative lesions, no rash or signs of infection present.  Vascular:  Dorsalis Pedis artery and Posterior Tibial artery pedal pulses are 2/4 bilateral with immedate capillary fill time. Pedal hair growth present. No varicosities and no lower extremity edema present bilateral. There is no pain with calf compression, swelling, warmth, erythema.   Neruologic: Grossly intact via light touch bilateral.   Musculoskeletal: Tenderness on the mid substance the Achilles tendon on the right side.  Thompson test is negative.  Minimal edema.  There is no erythema or warmth.  I am able to palpate the boundaries the Achilles tendon.  On the left side mild discomfort the first MPJ without any area of pinpoint tenderness.  No edema, erythema.  Muscular strength 5/5 in all groups tested bilateral.  Gait: Unassisted, Nonantalgic.       Assessment:   85 year old  female with right Achilles tendon-itis, left first MPJ capsulitis    Plan:  -Treatment options discussed including all alternatives, risks, and complications -Etiology of symptoms were discussed -X-rays were obtained and reviewed with the patient.  There is no evidence of acute fracture or stress fracture.  Arthritic changes noted. -On the right side discussed stretching, icing daily.  She can use Voltaren gel to this area as well as to the left first MPJ.  Heel is on the right side.  Return in about 6 weeks (around 08/08/2019).  Trula Slade DPM

## 2019-07-01 NOTE — Telephone Encounter (Signed)
I spoke with pt and informed the voltaren gel could be purchased over the counter for less than $20.00. pt states she will call [facility] to check their price.

## 2019-07-01 NOTE — Telephone Encounter (Signed)
Called [place] w/ [facility] and she stated that the order should be resent, too many clarifications, 2 sets of directions, times the medication should be taken and the number of tubes to be ordered. Please refax to [phone],ref# [id number].Any questions, please contact 1 [phone].

## 2019-07-02 NOTE — Telephone Encounter (Signed)
Pharmacy called to get clarifications on the script that was sent.. 1% cream 2grams topically 4xday is 1 tube enough or more clarify grams

## 2019-07-18 ENCOUNTER — Other Ambulatory Visit: Payer: Self-pay

## 2019-07-18 ENCOUNTER — Ambulatory Visit
Admission: RE | Admit: 2019-07-18 | Discharge: 2019-07-18 | Disposition: A | Discharge status: Home / Self Care | Payer: Medicare Other | Source: Ambulatory Visit | Attending: Internal Medicine | Admitting: Internal Medicine

## 2019-07-18 DIAGNOSIS — Z780 Asymptomatic menopausal state: Secondary | ICD-10-CM | POA: Diagnosis not present

## 2019-07-18 DIAGNOSIS — M85831 Other specified disorders of bone density and structure, right forearm: Secondary | ICD-10-CM | POA: Diagnosis not present

## 2019-07-18 DIAGNOSIS — M810 Age-related osteoporosis without current pathological fracture: Secondary | ICD-10-CM | POA: Diagnosis not present

## 2019-08-12 ENCOUNTER — Other Ambulatory Visit: Payer: Self-pay

## 2019-08-12 ENCOUNTER — Ambulatory Visit (INDEPENDENT_AMBULATORY_CARE_PROVIDER_SITE_OTHER): Payer: PPO | Admitting: Podiatry

## 2019-08-12 ENCOUNTER — Encounter: Payer: Self-pay | Admitting: Podiatry

## 2019-08-12 DIAGNOSIS — M7661 Achilles tendinitis, right leg: Secondary | ICD-10-CM | POA: Diagnosis not present

## 2019-08-12 DIAGNOSIS — M778 Other enthesopathies, not elsewhere classified: Secondary | ICD-10-CM | POA: Diagnosis not present

## 2019-08-12 MED ORDER — GABAPENTIN 100 MG PO CAPS: 100.0000 mg | ORAL_CAPSULE | Freq: Every day | ORAL | 0 refills | Status: DC

## 2019-08-12 NOTE — Patient Instructions (Signed)

## 2019-08-17 NOTE — Progress Notes (Signed)
Subjective: 85 year old female presents the office today for evaluation of right heel pain, Achilles tendinitis.  She states that overall she is doing better.  She was using the Voltaren gel which she stopped this after 3 weeks as asked with the package instructions said to do.  She still been stretching and icing and overall improved.  She has no other concerns today. Denies any systemic complaints such as fevers, chills, nausea, vomiting. No acute changes since last appointment, and no other complaints at this time.   Objective: AAO x3, NAD DP/PT pulses palpable bilaterally, CRT less than 3 seconds There is also mild discomfort although improved to the Achilles tendon.  Thompson test is negative and I am able to palpate the borders of the Achilles tendon.  No edema, erythema.  There is no significant discomfort the first MPJ.  No other areas of tenderness. No pain with calf compression, swelling, warmth, erythema  Assessment: Achilles tendinitis with improvement, capsulitis first MPJ  Plan: -All treatment options discussed with the patient including all alternatives, risks, complications.  -We will hold any further Voltaren gel.  Discussed she can use Biofreeze or Aspercreme with lidocaine.  Discussed she continue with stretching, icing daily.  I discussed formal physical therapy but she wants to hold off on this.  Continue shoe modifications and discussed orthotics as well. -Patient encouraged to call the office with any questions, concerns, change in symptoms.   Return in about 4 weeks (around 09/09/2019). or as needed  Trula Slade DPM

## 2019-09-17 ENCOUNTER — Ambulatory Visit (INDEPENDENT_AMBULATORY_CARE_PROVIDER_SITE_OTHER): Payer: PPO

## 2019-09-17 ENCOUNTER — Ambulatory Visit: Payer: PPO | Admitting: Family

## 2019-09-17 ENCOUNTER — Encounter: Payer: Self-pay | Admitting: Family

## 2019-09-17 DIAGNOSIS — M79672 Pain in left foot: Secondary | ICD-10-CM

## 2019-09-17 DIAGNOSIS — S93492A Sprain of other ligament of left ankle, initial encounter: Secondary | ICD-10-CM

## 2019-09-17 NOTE — Progress Notes (Signed)
Office Visit Note   Patient: Erica Key           Date of Birth: Aug 25, 1934           MRN: [id number] Visit Date: 09/17/2019              Requested by: Lavone Orn, MD [place] [address],  [place] [address] PCP: Lavone Orn, MD  Chief Complaint  Patient presents with  . Left Foot - Pain      HPI: The patient is an 85 year old woman seen today for initial evaluation of left ankle pain.  Just 2 days ago she was getting from a seated to a standing position holding onto her walker in her typical manner is unsure what happened but her left ankle twisted and gave out she had an inversion injury of the ankle.  She is concerned for fracture as she does have hardware from a previous ankle fracture on the left.  Today she is able to weight-bear with a cane in regular shoewear  Assessment & Plan: Visit Diagnoses:  1. Left foot pain     Plan: Placed in an ASO today discussed typical course.  Encouraged her to remove the brace and work on range of motion of the ankle.  She will follow-up in 3 to 4 weeks if she fails to improve.  Follow-Up Instructions: No follow-ups on file.   Left Ankle Exam   Tenderness  The patient is experiencing tenderness in the ATF and CF.  Swelling: mild  Range of Motion  The patient has normal left ankle ROM.   Tests  Anterior drawer: negative Varus tilt: negative  Other  Erythema: absent Sensation: normal      Patient is alert, oriented, no adenopathy, well-dressed, normal affect, normal respiratory effort.   Imaging: XR Ankle Complete Left  Result Date: 09/17/2019 Radiographs of left ankle without acute finding. No fracture. Stable alignment of fixation hardware, no loosening.   XR Foot 2 Views Left  Result Date: 09/17/2019 Radiographs of left foot negative for acute finding.   No images are attached to the encounter.  Labs: No results found for: HGBA1C, ESRSEDRATE, CRP, LABURIC, REPTSTATUS, GRAMSTAIN, CULT,  LABORGA   Lab Results  Component Value Date   ALBUMIN 4.4 09/10/2008   ALBUMIN 4.4 07/02/2008    No results found for: MG No results found for: VD25OH  No results found for: PREALBUMIN CBC EXTENDED Latest Ref Rng & Units 04/10/2012 09/13/2008 09/12/2008  WBC 4.0 - 10.5 K/uL - 14.7(H) 17.2(H)  RBC 3.87 - 5.11 MIL/uL - 3.92 3.67(L)  HGB 12.0 - 15.0 g/dL 11.9(L) 13.1 12.3  HCT 36 - 46 % 35.0(L) 37.3 35.3(L)  PLT 150 - 400 K/uL - 232 234  NEUTROABS - - - -  LYMPHSABS - - - -     There is no height or weight on file to calculate BMI.  Orders:  Orders Placed This Encounter  Procedures  . XR Foot 2 Views Left  . XR Ankle Complete Left   No orders of the defined types were placed in this encounter.    Procedures: No procedures performed  Clinical Data: No additional findings.  ROS:  All other systems negative, except as noted in the HPI. Review of Systems  Constitutional: Negative for chills and fever.  Musculoskeletal: Positive for arthralgias, gait problem and myalgias.    Objective: Vital Signs: There were no vitals taken for this visit.  Specialty Comments:  No specialty  comments available.  PMFS History: There are no problems to display for this patient.  Past Medical History:  Diagnosis Date  . Arthritis    Right hand  . Chronic kidney disease    Rt. kidney partial blockage  . Headache(784.0)    occ.  . High cholesterol   . Hypertension   . Swelling of ankle     History reviewed. No pertinent family history.  Past Surgical History:  Procedure Laterality Date  . ABDOMINAL HYSTERECTOMY    . ANKLE SURGERY  2008  . Bladder Tack    . COLONOSCOPY WITH PROPOFOL N/A 04/10/2012   Procedure: COLONOSCOPY WITH PROPOFOL;  Surgeon: Garlan Fair, MD;  Location: [facility];  Service: Endoscopy;  Laterality: N/A;  . EYE SURGERY     Bilateral catarct extraction   Social History   Occupational History  . Not on file  Tobacco Use  . Smoking status:  Never Smoker  . Smokeless tobacco: Never Used  Substance and Sexual Activity  . Alcohol use: No  . Drug use: No  . Sexual activity: Not on file

## 2020-05-04 DIAGNOSIS — E7800 Pure hypercholesterolemia, unspecified: Secondary | ICD-10-CM | POA: Diagnosis not present

## 2020-05-04 DIAGNOSIS — Z1389 Encounter for screening for other disorder: Secondary | ICD-10-CM | POA: Diagnosis not present

## 2020-05-04 DIAGNOSIS — R202 Paresthesia of skin: Secondary | ICD-10-CM | POA: Diagnosis not present

## 2020-05-04 DIAGNOSIS — M810 Age-related osteoporosis without current pathological fracture: Secondary | ICD-10-CM | POA: Diagnosis not present

## 2020-05-04 DIAGNOSIS — Z23 Encounter for immunization: Secondary | ICD-10-CM | POA: Diagnosis not present

## 2020-05-04 DIAGNOSIS — I10 Essential (primary) hypertension: Secondary | ICD-10-CM | POA: Diagnosis not present

## 2020-05-04 DIAGNOSIS — Z0000 Encounter for general adult medical examination without abnormal findings: Secondary | ICD-10-CM | POA: Diagnosis not present

## 2020-05-14 DIAGNOSIS — D3131 Benign neoplasm of right choroid: Secondary | ICD-10-CM | POA: Diagnosis not present

## 2020-05-14 DIAGNOSIS — H04123 Dry eye syndrome of bilateral lacrimal glands: Secondary | ICD-10-CM | POA: Diagnosis not present

## 2020-05-14 DIAGNOSIS — H43813 Vitreous degeneration, bilateral: Secondary | ICD-10-CM | POA: Diagnosis not present

## 2020-05-14 DIAGNOSIS — H02834 Dermatochalasis of left upper eyelid: Secondary | ICD-10-CM | POA: Diagnosis not present

## 2020-05-14 DIAGNOSIS — Z961 Presence of intraocular lens: Secondary | ICD-10-CM | POA: Diagnosis not present

## 2020-05-14 DIAGNOSIS — H02831 Dermatochalasis of right upper eyelid: Secondary | ICD-10-CM | POA: Diagnosis not present

## 2020-07-27 DIAGNOSIS — Z20828 Contact with and (suspected) exposure to other viral communicable diseases: Secondary | ICD-10-CM | POA: Diagnosis not present

## 2021-02-16 ENCOUNTER — Other Ambulatory Visit: Payer: Self-pay

## 2021-02-16 ENCOUNTER — Ambulatory Visit: Payer: PPO | Admitting: Orthopaedic Surgery

## 2021-02-16 ENCOUNTER — Encounter: Payer: Self-pay | Admitting: Orthopaedic Surgery

## 2021-02-16 ENCOUNTER — Ambulatory Visit (INDEPENDENT_AMBULATORY_CARE_PROVIDER_SITE_OTHER): Payer: PPO

## 2021-02-16 VITALS — BP 174/78 | HR 61 | Ht 67.5 in | Wt 177.6 lb

## 2021-02-16 DIAGNOSIS — M7541 Impingement syndrome of right shoulder: Secondary | ICD-10-CM | POA: Diagnosis not present

## 2021-02-16 DIAGNOSIS — M25511 Pain in right shoulder: Secondary | ICD-10-CM

## 2021-02-16 DIAGNOSIS — G8929 Other chronic pain: Secondary | ICD-10-CM | POA: Diagnosis not present

## 2021-02-16 MED ORDER — LIDOCAINE HCL 1 % IJ SOLN
0.5000 mL | INTRAMUSCULAR | Status: AC | PRN
  Administered 2021-02-16: .5 mL

## 2021-02-16 MED ORDER — METHYLPREDNISOLONE ACETATE 40 MG/ML IJ SUSP
40.0000 mg | INTRAMUSCULAR | Status: AC | PRN
  Administered 2021-02-16: 40 mg via INTRA_ARTICULAR

## 2021-02-16 MED ORDER — BUPIVACAINE HCL 0.25 % IJ SOLN
4.0000 mL | INTRAMUSCULAR | Status: AC | PRN
  Administered 2021-02-16: 4 mL via INTRA_ARTICULAR

## 2021-02-16 NOTE — Progress Notes (Signed)
Office Visit Note   Patient: Erica Key           Date of Birth: 03/08/1934           MRN: [id number] Visit Date: 02/16/2021              Requested by: Lavone Orn, MD [address] [place],  [place] [address] PCP: Lavone Orn, MD   Assessment & Plan: Visit Diagnoses:  1. Chronic right shoulder pain   2. Impingement syndrome of right shoulder     Plan: Subacromial injection performed which was reproducing her pain.  She has persistent problems she will call us and we can order an MRI scan .  Otherwise return as needed.  Follow-Up Instructions: No follow-ups on file.   Orders:  Orders Placed This Encounter  Procedures   Large Joint Inj: R subacromial bursa   XR Shoulder Right   No orders of the defined types were placed in this encounter.     Procedures: Large Joint Inj: R subacromial bursa on 02/16/2021 9:26 AM Indications: pain Details: 22 G 1.5 in needle  Arthrogram: No  Medications: 4 mL bupivacaine 0.25 %; 40 mg methylPREDNISolone acetate 40 MG/ML; 0.5 mL lidocaine 1 % Outcome: tolerated well, no immediate complications Procedure, treatment alternatives, risks and benefits explained, specific risks discussed. Consent was given by the patient. Immediately prior to procedure a time out was called to verify the correct patient, procedure, equipment, support staff and site/side marked as required. Patient was prepped and draped in the usual sterile fashion.      Clinical Data: No additional findings.   Subjective: Chief Complaint  Patient presents with   Right Shoulder - Pain    HPI 87 year old female seen with right shoulder pain present for several months she does kneeling or crocheting does not recall anything with outstretch or overhead activity no associated numbness or tingling no problem with the opposite left arm.  She has problems with bathing and dressing she is right-hand dominant.  No history of falls or injury.  She has had  previous ankle fracture surgery by Dr. Marlou Sa back in 2009 doing well.  Review of Systems all other systems noncontributory to HPI.   Objective: Vital Signs: BP (!) 174/78    Pulse 61    Ht 5' 7.5" (1.715 m)    Wt 177 lb 9.6 oz (80.6 kg)    BMI 27.41 kg/m   Physical Exam Constitutional:      Appearance: She is well-developed.  HENT:     Head: Normocephalic.     Right Ear: External ear normal.     Left Ear: External ear normal. There is no impacted cerumen.  Eyes:     Pupils: Pupils are equal, round, and reactive to light.  Neck:     Thyroid: No thyromegaly.     Trachea: No tracheal deviation.  Cardiovascular:     Rate and Rhythm: Normal rate.  Pulmonary:     Effort: Pulmonary effort is normal.  Abdominal:     Palpations: Abdomen is soft.  Musculoskeletal:     Cervical back: No rigidity.  Skin:    General: Skin is warm and dry.  Neurological:     Mental Status: She is alert and oriented to person, place, and time.  Psychiatric:        Behavior: Behavior normal.    Ortho Exam normal cervical range of motion no brachial plexus tenderness negative Spurling positive impingement right negative left.  Biceps tendon normal.  Negative drop arm test.  No shoulder subluxation negative apprehension.  Specialty Comments:  No specialty comments available.  Imaging: XR Shoulder Right  Result Date: 02/16/2021 Three-view x-rays right shoulder obtained and reviewed.  This shows mild glenohumeral spurring.  No high riding head mild degenerative changes acromioclavicular joint.  No soft tissue calcification. Impression: Mild right shoulder osteoarthritis.  Glenohumeral joint is in satisfactory position.    PMFS History: Patient Active Problem List   Diagnosis Date Noted   Impingement syndrome of right shoulder 02/16/2021   Past Medical History:  Diagnosis Date   Arthritis    Right hand   Chronic kidney disease    Rt. kidney partial blockage   Headache(784.0)    occ.   High  cholesterol    Hypertension    Swelling of ankle     History reviewed. No pertinent family history.  Past Surgical History:  Procedure Laterality Date   ABDOMINAL HYSTERECTOMY     ANKLE SURGERY  2008   Bladder Tack     COLONOSCOPY WITH PROPOFOL N/A 04/10/2012   Procedure: COLONOSCOPY WITH PROPOFOL;  Surgeon: Garlan Fair, MD;  Location: [facility];  Service: Endoscopy;  Laterality: N/A;   EYE SURGERY     Bilateral catarct extraction   Social History   Occupational History   Not on file  Tobacco Use   Smoking status: Never   Smokeless tobacco: Never  Substance and Sexual Activity   Alcohol use: No   Drug use: No   Sexual activity: Not on file

## 2021-04-12 DIAGNOSIS — R300 Dysuria: Secondary | ICD-10-CM | POA: Diagnosis not present

## 2021-05-18 DIAGNOSIS — Z23 Encounter for immunization: Secondary | ICD-10-CM | POA: Diagnosis not present

## 2021-05-18 DIAGNOSIS — R202 Paresthesia of skin: Secondary | ICD-10-CM | POA: Diagnosis not present

## 2021-05-18 DIAGNOSIS — M810 Age-related osteoporosis without current pathological fracture: Secondary | ICD-10-CM | POA: Diagnosis not present

## 2021-05-18 DIAGNOSIS — F439 Reaction to severe stress, unspecified: Secondary | ICD-10-CM | POA: Diagnosis not present

## 2021-05-18 DIAGNOSIS — I10 Essential (primary) hypertension: Secondary | ICD-10-CM | POA: Diagnosis not present

## 2021-05-18 DIAGNOSIS — Z0000 Encounter for general adult medical examination without abnormal findings: Secondary | ICD-10-CM | POA: Diagnosis not present

## 2021-05-18 DIAGNOSIS — E7800 Pure hypercholesterolemia, unspecified: Secondary | ICD-10-CM | POA: Diagnosis not present

## 2021-05-18 DIAGNOSIS — Z1331 Encounter for screening for depression: Secondary | ICD-10-CM | POA: Diagnosis not present

## 2021-05-18 DIAGNOSIS — S90222A Contusion of left lesser toe(s) with damage to nail, initial encounter: Secondary | ICD-10-CM | POA: Diagnosis not present

## 2023-04-17 ENCOUNTER — Telehealth: Payer: Self-pay | Admitting: Internal Medicine

## 2023-04-17 NOTE — Telephone Encounter (Signed)
 Can you call?  Most 87 year old patients have a long-term relationship with a regular doctor, and I usually encourage 37 year olds to stay with their long-term doctor.  If there is a specific reason that she feels she can no longer go to them, I can see her.  I think a new patient appointment is a few months out.

## 2023-04-17 NOTE — Telephone Encounter (Signed)
 Copied from CRM [id number]. Topic: Appointments - Scheduling Inquiry for Clinic >> Apr 17, 2023 12:54 PM [name] wrote: Reason for CRM: Patient is calling to see if Dr. Patsy Lager will take her as a new patient, patient states her sister is a patient of Dr. Patsy Lager and other family members as well, please reach out to patient, thanks.  Erica Key [phone]

## 2023-04-24 ENCOUNTER — Telehealth: Payer: Self-pay | Admitting: Internal Medicine

## 2023-04-24 NOTE — Telephone Encounter (Signed)
 LVM on patient mobile number, [phone] not able to leave a message

## 2023-04-24 NOTE — Telephone Encounter (Signed)
 Copied from CRM [id number]. Topic: Appointments - Scheduling Inquiry for Clinic >> Apr 17, 2023 12:54 PM [name] wrote: Reason for CRM: Patient is calling to see if Dr. Patsy Lager will take her as a new patient, patient states her sister is a patient of Dr. Patsy Lager and other family members as well, please reach out to patient, thanks.  Erica Key [phone]

## 2023-04-24 NOTE — Telephone Encounter (Signed)
 My prior message did not seem to be delivered.  I usually encourage 6 year olds to stay with their long-term doctor - the long-term care really does make a difference.  If she genuinely feels she can no longer get care there, I can see her, but I think my new patient appointments are several months out.   Can you call?   Most 87 year old patients have a long-term relationship with a regular doctor, and I usually encourage 71 year olds to stay with their long-term doctor.  If there is a specific reason that she feels she can no longer go to them, I can see her.  I think a new patient appointment is a few months out.

## 2023-04-25 NOTE — Telephone Encounter (Signed)
 Spoke with pt, pt states her prev provider, Dr. Valentina Lucks, has retired. Let pt know the first available slot will be mid June, pt states the wait was fine.

## 2023-06-29 ENCOUNTER — Encounter: Payer: Self-pay | Admitting: Family Medicine

## 2023-06-29 ENCOUNTER — Ambulatory Visit: Admitting: Family Medicine

## 2023-06-29 VITALS — BP 180/78 | HR 52 | Temp 98.0°F | Ht 66.5 in | Wt 166.0 lb

## 2023-06-29 DIAGNOSIS — I10 Essential (primary) hypertension: Secondary | ICD-10-CM | POA: Diagnosis not present

## 2023-06-29 DIAGNOSIS — E782 Mixed hyperlipidemia: Secondary | ICD-10-CM | POA: Diagnosis not present

## 2023-06-29 DIAGNOSIS — M810 Age-related osteoporosis without current pathological fracture: Secondary | ICD-10-CM | POA: Diagnosis not present

## 2023-06-29 DIAGNOSIS — E119 Type 2 diabetes mellitus without complications: Secondary | ICD-10-CM | POA: Diagnosis not present

## 2023-06-29 DIAGNOSIS — Z8679 Personal history of other diseases of the circulatory system: Secondary | ICD-10-CM

## 2023-06-29 DIAGNOSIS — Z79899 Other long term (current) drug therapy: Secondary | ICD-10-CM

## 2023-06-29 MED ORDER — LISINOPRIL 40 MG PO TABS: 40.0000 mg | ORAL_TABLET | Freq: Every morning | ORAL | 3 refills | Status: AC

## 2023-06-29 MED ORDER — GABAPENTIN 100 MG PO CAPS: 200.0000 mg | ORAL_CAPSULE | Freq: Two times a day (BID) | ORAL | 1 refills | Status: DC

## 2023-06-29 NOTE — Progress Notes (Signed)
 Dorman Calderwood T. Nazim Kadlec, MD, CAQ Sports Medicine [facility] at [facility] [address] [place], [address]  Phone: [phone]  FAX: [phone]  Erica Key - 89 y.o. female  MRN [id number]  Date of Birth: July 04, 1934  Date: 06/29/2023  PCP: Lysle Saunas, MD (Inactive)  Referral: No ref. provider found  Chief Complaint  Patient presents with   Establish Care   Subjective:   Erica Key is a 89 y.o. very pleasant female patient with Body mass index is 26.39 kg/m. who presents with the following:  The patient is a new patient to me, and it looks as if she is a new patient to [facility] health system.  She has been a patient at [facility].  H/o Rheumatic fever  H/o heart problems, used to see Dr. Katheryne Pane?  There are no notes in the system, so she must of seen cardiology many years ago  Elevated blood pressure, 180/78 on my recheck  Immunization History  Administered Date(s) Administered   Fluzone Influenza virus vaccine,trivalent (IIV3), split virus 11/17/2009, 12/24/2010, 10/17/2012, 10/17/2013, 10/03/2014, 10/29/2015, 11/01/2016, 11/17/2017, 10/15/2018, 11/14/2019, 11/19/2020   Influenza, High Dose Seasonal PF 11/14/2013   PFIZER(Purple Top)SARS-COV-2 Vaccination 02/02/2019, 02/21/2019, 11/14/2019   Pfizer Covid-19 Vaccine Bivalent Booster 16yrs & up 11/19/2020   Pneumococcal Conjugate-13 02/05/2014   Pneumococcal Polysaccharide-23 01/17/2006   Tdap 12/24/2010   Zoster, Live 06/24/2010, 05/04/2020, 05/18/2021     - The patient does not think that she has diabetes.  Her chart in [facility] seems to indicate that she does have diabetes. Diabetes Mellitus: Tolerating Medications: yes Compliance with diet: fair, Body mass index is 26.39 kg/m. Exercise: minimal / intermittent Avg blood sugars at home: not checking Foot problems: none Hypoglycemia: none No nausea, vomitting, blurred vision, polyuria.    Lipids: Doing well, stable. Tolerating meds fine  with no SE. Panel reviewed with patient.  Lipids: No results found for: CHOL No results found for: HDL No results found for: LDLCALC No results found for: TRIG No results found for: [facility]  Lab Results  Component Value Date   ALT 21 09/10/2008   AST 22 09/10/2008   ALKPHOS 69 09/10/2008   BILITOT 0.5 09/10/2008    HTN: Tolerating all medications without side effects Stable and at goal No CP, no sob. No HA.  BP Readings from Last 3 Encounters:  06/29/23 (!) 170/72  02/16/21 (!) 174/78  06/27/19 (!) 152/74    Basic Metabolic Panel:    Component Value Date/Time   NA 140 04/10/2012 1108   K 4.0 04/10/2012 1108   CL 102 09/10/2008 0830   CO2 30 09/10/2008 0830   BUN 14 09/10/2008 0830   CREATININE 0.55 09/10/2008 0830   GLUCOSE 95 04/10/2012 1108   CALCIUM 9.7 09/10/2008 0830     Review of Systems is noted in the HPI, as appropriate  Objective:   BP (!) 170/72   Pulse (!) 52   Temp 98 F (36.7 C) (Temporal)   Ht 5' 6.5 (1.689 m)   Wt 166 lb (75.3 kg)   SpO2 96%   BMI 26.39 kg/m   GEN: No acute distress; alert,appropriate. CV: RRR, no m/g/r  PULM: Normal respiratory rate, no accessory muscle use. No wheezes, crackles or rhonchi  PSYCH: Normally interactive.   Laboratory and Imaging Data:  Assessment and Plan:     ICD-10-CM   1. Mixed hyperlipidemia  E78.2 Lipid panel    CANCELED: Lipid panel    2. Controlled type  2 diabetes mellitus without complication, without long-term current use of insulin (HCC)  E11.9 Basic metabolic panel with GFR    Hemoglobin A1c    CANCELED: Basic metabolic panel with GFR    CANCELED: Hemoglobin A1c    3. Osteoporosis, senile  M81.0     4. Essential hypertension  I10     5. Encounter for long-term (current) use of medications  Z79.899 Basic metabolic panel with GFR    CBC with Differential/Platelet    Hepatic function panel    TSH    CANCELED: Basic metabolic panel with GFR    CANCELED: CBC with  Differential/Platelet    CANCELED: Hepatic function panel    CANCELED: TSH    6. History of rheumatic fever  Z86.60      89 years old, multiple medical problems.  I am having difficulty finding all of her information in the chart, as we do not connect with [facility] healthcare record very well.  I am going to get paper copies of her medical record.  Question possible diabetes?  The chart does indicate she has diabetes, the patient does not think she has diabetes.  Unsteady hypertension, increase lisinopril to 40 mg.  Continue other medications.  Otherwise, we will review the medical record.  Medication Management during today's office visit: Meds ordered this encounter  Medications   lisinopril (ZESTRIL) 40 MG tablet    Sig: Take 1 tablet (40 mg total) by mouth every morning.    Dispense:  90 tablet    Refill:  3   gabapentin  (NEURONTIN ) 100 MG capsule    Sig: Take 2 capsules (200 mg total) by mouth 2 (two) times daily. Take 1 po    Dispense:  360 capsule    Refill:  1   Medications Discontinued During This Encounter  Medication Reason   simvastatin (ZOCOR) 40 MG tablet Dose change   lisinopril (PRINIVIL,ZESTRIL) 20 MG tablet Reorder   gabapentin  (NEURONTIN ) 100 MG capsule Reorder    Orders placed today for conditions managed today: Orders Placed This Encounter  Procedures   Basic metabolic panel with GFR   CBC with Differential/Platelet   Hemoglobin A1c   Hepatic function panel   Lipid panel   TSH    Disposition: Return in about 3 months (around 09/29/2023) for blood pressure follow-up.  Dragon Medical One speech-to-text software was used for transcription in this dictation.  Possible transcriptional errors can occur using Animal nutritionist.   Signed,  Ranny Bye. Jashon Ishida, MD   Outpatient Encounter Medications as of 06/29/2023  Medication Sig   acetaminophen (TYLENOL) 500 MG tablet Take 1,000 mg by mouth at bedtime.   amLODipine (NORVASC) 10 MG tablet Take 10 mg  by mouth at bedtime.   aspirin EC 81 MG tablet Take 81 mg by mouth every morning.   Calcium Carbonate-Vitamin D (CALTRATE 600+D PO) Take 1 tablet by mouth 2 (two) times daily.   CAPSAICIN EX Apply 1 Application topically 4 (four) times daily as needed.   Cholecalciferol (VITAMIN D3) 2000 UNITS TABS Take 2,000 Units by mouth every morning.   Cinnamon 500 MG TABS Take 500 mg by mouth 2 (two) times daily.   Cyanocobalamin (VITAMIN B 12 PO) Take 1 tablet by mouth every morning.   diclofenac  Sodium (VOLTAREN ) 1 % GEL Apply 2 g topically 4 (four) times daily.   diphenhydrAMINE (BENADRYL) 25 MG tablet Take 50 mg by mouth at bedtime.   Glucosamine Sulfate (CVS GLUCOSAMINE SULFATE) 1000 MG CAPS Take 1,000 mg by  mouth 2 (two) times daily.   IRON PO Take 1 tablet by mouth every morning.   metoprolol (LOPRESSOR) 50 MG tablet Take 50 mg by mouth 2 (two) times daily.   Multiple Vitamin (MULTIVITAMIN WITH MINERALS) TABS Take 1 tablet by mouth every morning.   OVER THE COUNTER MEDICATION Take 1,000 mg by mouth 2 (two) times daily. Tumeric 500mg .   OVER THE COUNTER MEDICATION Take 1 each by mouth daily. CBD Gummies   simvastatin (ZOCOR) 20 MG tablet Take 20 mg by mouth at bedtime.   [DISCONTINUED] gabapentin  (NEURONTIN ) 100 MG capsule Take 1 capsule (100 mg total) by mouth at bedtime.   [DISCONTINUED] lisinopril (PRINIVIL,ZESTRIL) 20 MG tablet Take 20 mg by mouth every morning.   gabapentin  (NEURONTIN ) 100 MG capsule Take 2 capsules (200 mg total) by mouth 2 (two) times daily. Take 1 po   lisinopril (ZESTRIL) 40 MG tablet Take 1 tablet (40 mg total) by mouth every morning.   [DISCONTINUED] simvastatin (ZOCOR) 40 MG tablet Take 40 mg by mouth at bedtime.   No facility-administered encounter medications on file as of 06/29/2023.

## 2023-06-29 NOTE — Patient Instructions (Addendum)
 Blood pressure:  Increase lisinopril to 40 mg a day   Neuropathy: Increase Gabapentin  100 mg capsules to 2 capsules twice a day

## 2023-06-30 ENCOUNTER — Ambulatory Visit: Payer: Self-pay | Admitting: Family Medicine

## 2023-06-30 LAB — CBC WITH DIFFERENTIAL/PLATELET
Absolute Lymphocytes: 2831 {cells}/uL (ref 850–3900)
Absolute Monocytes: 637 {cells}/uL (ref 200–950)
Basophils Absolute: 29 {cells}/uL (ref 0–200)
Basophils Relative: 0.3 %
Eosinophils Absolute: 133 {cells}/uL (ref 15–500)
Eosinophils Relative: 1.4 %
HCT: 39.8 % (ref 35.0–45.0)
Hemoglobin: 13 g/dL (ref 11.7–15.5)
MCH: 33.1 pg — ABNORMAL HIGH (ref 27.0–33.0)
MCHC: 32.7 g/dL (ref 32.0–36.0)
MCV: 101.3 fL — ABNORMAL HIGH (ref 80.0–100.0)
MPV: 9.6 fL (ref 7.5–12.5)
Monocytes Relative: 6.7 %
Neutro Abs: 5871 {cells}/uL (ref 1500–7800)
Neutrophils Relative %: 61.8 %
Platelets: 192 10*3/uL (ref 140–400)
RBC: 3.93 10*6/uL (ref 3.80–5.10)
RDW: 11.9 % (ref 11.0–15.0)
Total Lymphocyte: 29.8 %
WBC: 9.5 10*3/uL (ref 3.8–10.8)

## 2023-06-30 LAB — BASIC METABOLIC PANEL WITH GFR
BUN: 13 mg/dL (ref 7–25)
CO2: 27 mmol/L (ref 20–32)
Calcium: 10 mg/dL (ref 8.6–10.4)
Chloride: 103 mmol/L (ref 98–110)
Creat: 0.7 mg/dL (ref 0.60–0.95)
Glucose, Bld: 116 mg/dL — ABNORMAL HIGH (ref 65–99)
Potassium: 5.1 mmol/L (ref 3.5–5.3)
Sodium: 139 mmol/L (ref 135–146)
eGFR: 84 mL/min/{1.73_m2} (ref >=60)

## 2023-06-30 LAB — LIPID PANEL
Cholesterol: 162 mg/dL (ref <200)
HDL: 57 mg/dL (ref >=50)
LDL Cholesterol (Calc): 74 mg/dL
Non-HDL Cholesterol (Calc): 105 mg/dL (ref <130)
Total CHOL/HDL Ratio: 2.8 (calc) (ref <5.0)
Triglycerides: 223 mg/dL — ABNORMAL HIGH (ref <150)

## 2023-06-30 LAB — HEPATIC FUNCTION PANEL
AG Ratio: 1.7 (calc) (ref 1.0–2.5)
ALT: 19 U/L (ref 6–29)
AST: 14 U/L (ref 10–35)
Albumin: 4.5 g/dL (ref 3.6–5.1)
Alkaline phosphatase (APISO): 63 U/L (ref 37–153)
Bilirubin, Direct: 0.1 mg/dL (ref 0.0–0.2)
Globulin: 2.6 g/dL (ref 1.9–3.7)
Indirect Bilirubin: 0.4 mg/dL (ref 0.2–1.2)
Total Bilirubin: 0.5 mg/dL (ref 0.2–1.2)
Total Protein: 7.1 g/dL (ref 6.1–8.1)

## 2023-06-30 LAB — HEMOGLOBIN A1C
Hgb A1c MFr Bld: 5.5 % (ref <5.7)
Mean Plasma Glucose: 111 mg/dL
eAG (mmol/L): 6.2 mmol/L

## 2023-06-30 LAB — TSH: TSH: 1.72 m[IU]/L (ref 0.40–4.50)

## 2023-07-06 NOTE — Telephone Encounter (Signed)
 Pt calling asking about her 6/12 labs regarding her HgbA1c and Triglycerides because she was told they were both abnormal in the higher range.  Pt notified HgbA1C did not flag as being high but in a normal range. Nurse explained the triglycerides came up high and Nurse can alert office so they can alert pt if any adjustments need to be made that they will reach out to the patient.    Copied from CRM [id number]. Topic: Clinical - Lab/Test Results >> Jul 06, 2023 10:49 AM Rosamond Comes wrote: Reason for CRM: patient called in asking about lab results. Read lab note verbatim:  Your labs all look basically okay   Kidney function and liver function are normal, better than most 89 year old. Blood count normal.  Cholesterol looks good, triglycerides are mildly elevated. Thyroid  normal   You do have some elevated blood sugars in the prediabetes range.  I am not going to be able to sort out the blood sugar and diabetes question until I get all of the old notes.     Written by Scherrie Curt, MD on 06/30/2023  8:36 AM EDT  Patient would like to talk to a nurse about this.

## 2023-08-09 ENCOUNTER — Other Ambulatory Visit: Payer: Self-pay | Admitting: Family Medicine

## 2023-08-09 MED ORDER — METOPROLOL TARTRATE 50 MG PO TABS: 50.0000 mg | ORAL_TABLET | Freq: Two times a day (BID) | ORAL | 0 refills | Status: DC

## 2023-09-13 ENCOUNTER — Other Ambulatory Visit: Payer: Self-pay | Admitting: Family Medicine

## 2023-09-13 MED ORDER — SIMVASTATIN 20 MG PO TABS: 20.0000 mg | ORAL_TABLET | Freq: Every day | ORAL | 3 refills | Status: AC

## 2023-09-24 NOTE — Progress Notes (Signed)
 Tremar Wickens T. Tyliah Schlereth, MD, CAQ Sports Medicine [facility] at [facility] [address] [place], [address]  Phone: [phone]  FAX: [phone]  Erica Key - 89 y.o. female  MRN [id number]  Date of Birth: [birth year]  Date: 10/02/2023  PCP: Watt Mirza, MD  Referral: No ref. provider found  Chief Complaint  Patient presents with   Medical Management of Chronic Issues   Subjective:   Erica Key is a 89 y.o. very pleasant female patient with Body mass index is 27.03 kg/m. who presents with the following:  Discussed the use of AI scribe software for clinical note transcription with the patient, who gave verbal consent to proceed.  This is a recent new patient to me who transferred from [facility] physicians.  She had very high blood pressure when initially saw her, and wanted her to follow-up in this regard. Amlodipine 10 mg, lisinopril  40 mg, metoprolol  50 mg p.o. twice daily  She had some concern that maybe she did not have diabetes the last time she was in the office, but I confirmed this with her records from her prior physician in [place]. History of Present Illness Erica Key is an 89 year old female with hypertension and diabetes who presents with sinus pressure and headache.  She has been experiencing sinus pressure and headaches for over a month, described as pressure in the sinuses. She suspects this may be related to allergies, as she typically experiences allergies in the spring and fall. She has not used Flonase recently and has not tried antihistamines like Allegra or Zyrtec.  She has a history of hypertension and is currently taking amlodipine 10 mg daily, lisinopril  40 mg daily, and metoprolol  50 mg twice daily. She monitors her blood pressure at home but has had issues with her blood pressure cuff.   She has been informed by different doctors about having diabetes, although her A1c levels have remained below 7. She is cautious  with her diet, avoiding sweets, and is aware of a family history of diabetes, as both her parents and siblings have had it.  She experiences neuropathy in her feet and legs, which is particularly bothersome at night. She takes gabapentin , one in the morning and three at night, and uses a cream on her feet for relief. The gabapentin  does not make her tired.  She has a history of rheumatic fever at age 27 and was told by a doctor that one side of her heart is enlarged. She has not had recent heart studies but recalls a normal stress test from many years ago.    Review of Systems is noted in the HPI, as appropriate  Objective:   BP (!) 162/78   Pulse (!) 54   Temp 98.4 F (36.9 C) (Temporal)   Ht 5' 6.5 (1.689 m)   Wt 170 lb (77.1 kg)   SpO2 98%   BMI 27.03 kg/m   GEN: No acute distress; alert,appropriate. CV: RRR, no m/g/r  PULM: Breathing comfortably in no respiratory distress PSYCH: Normally interactive.   Physical Exam VITALS: BP- 170/80 CARDIOVASCULAR: Heart sounds normal. Blood pressure is 170/80 mmHg.  Laboratory and Imaging Data: Lab Review:     Latest Ref Rng & Units 06/29/2023    9:54 AM 04/10/2012   11:08 AM 09/13/2008    5:21 AM  CBC EXTENDED  WBC 3.8 - 10.8 Thousand/uL 9.5   14.7   RBC 3.80 - 5.10 Million/uL 3.93   3.92  Hemoglobin 11.7 - 15.5 g/dL 86.9  88.0  86.8   HCT 35.0 - 45.0 % 39.8  35.0  37.3   Platelets 140 - 400 Thousand/uL 192   232   NEUT# 1,500 - 7,800 cells/uL 5,871          Latest Ref Rng & Units 06/29/2023    9:54 AM 04/10/2012   11:08 AM 09/10/2008    8:30 AM  BMP  Glucose 65 - 99 mg/dL 883  95  880   BUN 7 - 25 mg/dL 13   14   Creatinine 9.39 - 0.95 mg/dL 9.29   9.44   BUN/Creat Ratio 6 - 22 (calc) SEE NOTE:     Sodium 135 - 146 mmol/L 139  140  139   Potassium 3.5 - 5.3 mmol/L 5.1  4.0  4.4   Chloride 98 - 110 mmol/L 103   102   CO2 20 - 32 mmol/L 27   30   Calcium 8.6 - 10.4 mg/dL 89.9   9.7        Latest Ref Rng & Units  06/29/2023    9:54 AM 09/10/2008    8:30 AM 07/02/2008   12:01 PM  Hepatic Function  Total Protein 6.1 - 8.1 g/dL 7.1  7.6  7.7   Albumin 3.5 - 5.2 g/dL  4.4  4.4   AST 10 - 35 U/L 14  22  19    ALT 6 - 29 U/L 19  21  17    Alk Phosphatase 39 - 117 U/L  69  76   Total Bilirubin 0.2 - 1.2 mg/dL 0.5  0.5  0.4   Bilirubin, Direct 0.0 - 0.2 mg/dL 0.1       Lab Results  Component Value Date   CHOL 162 06/29/2023   Lab Results  Component Value Date   HDL 57 06/29/2023   Lab Results  Component Value Date   LDLCALC 74 06/29/2023   Lab Results  Component Value Date   TRIG 223 (H) 06/29/2023   Lab Results  Component Value Date   CHOLHDL 2.8 06/29/2023   No results for input(s): PSA in the last 72 hours. No results found for: HCVAB No results found for: [facility]   Lab Results  Component Value Date   HGBA1C 5.5 06/29/2023   Lab Results  Component Value Date   LDLCALC 74 06/29/2023   CREATININE 0.70 06/29/2023     Assessment and Plan:     ICD-10-CM   1. Essential hypertension  I10     2. Controlled type 2 diabetes mellitus without complication, without long-term current use of insulin (HCC)  E11.9     3. Seasonal allergic rhinitis due to pollen  J30.1      Assessment & Plan Essential hypertension Blood pressure elevated at 170/80. Concerns about increasing metoprolol  due to bradycardia and fall risk. Target blood pressure <150/90. - Check blood pressure twice daily for two weeks, morning and afternoon/evening. - Report blood pressure readings after two weeks via MyChart, phone call, or mail. - Consider adding a fourth antihypertensive if blood pressure remains elevated.  Type 2 diabetes mellitus -she does not want to believe that she has diabetes.  There is clear documentation from lab work from [facility]. A1c levels controlled below 7. - Continue current diabetes management and monitoring.  Peripheral neuropathy Significant neuropathic pain in feet and legs,  especially at night. Gabapentin  provides some relief. - Adjust gabapentin  to two tablets in the morning and three at night -  she wants to hold on that for now. - Continue using topical cream for symptomatic relief.  Allergic rhinitis Symptoms include sinus pressure and headache, likely seasonal allergies. Flonase previously provided relief. - Resume use of Flonase for symptom relief.   Disposition: Return in about 6 months (around 03/31/2024) for Full annual physical.  Dragon Medical One speech-to-text software was used for transcription in this dictation.  Possible transcriptional errors can occur using Animal nutritionist.   Signed,  Jacques DASEN. Magdiel Bartles, MD   Outpatient Encounter Medications as of 10/02/2023  Medication Sig   acetaminophen (TYLENOL) 500 MG tablet Take 1,000 mg by mouth at bedtime.   amLODipine (NORVASC) 10 MG tablet Take 10 mg by mouth at bedtime.   aspirin EC 81 MG tablet Take 81 mg by mouth every morning.   Calcium Carbonate-Vitamin D (CALTRATE 600+D PO) Take 1 tablet by mouth 2 (two) times daily.   CAPSAICIN EX Apply 1 Application topically 4 (four) times daily as needed.   Cholecalciferol (VITAMIN D3) 2000 UNITS TABS Take 2,000 Units by mouth every morning.   Cinnamon 500 MG TABS Take 500 mg by mouth 2 (two) times daily.   diphenhydrAMINE (BENADRYL) 25 MG tablet Take 50 mg by mouth at bedtime.   gabapentin  (NEURONTIN ) 100 MG capsule Take 2 capsules (200 mg total) by mouth 2 (two) times daily. Take 1 po   Glucosamine Sulfate (CVS GLUCOSAMINE SULFATE) 1000 MG CAPS Take 1,000 mg by mouth 2 (two) times daily.   IRON PO Take 1 tablet by mouth every morning.   lisinopril  (ZESTRIL ) 40 MG tablet Take 1 tablet (40 mg total) by mouth every morning.   metoprolol  tartrate (LOPRESSOR ) 50 MG tablet Take 1 tablet (50 mg total) by mouth 2 (two) times daily.   Multiple Vitamin (MULTIVITAMIN WITH MINERALS) TABS Take 1 tablet by mouth every morning.   OVER THE COUNTER MEDICATION Take  1,000 mg by mouth 2 (two) times daily. Tumeric 500mg .   OVER THE COUNTER MEDICATION Take 1 each by mouth daily. CBD Gummies   Cyanocobalamin (VITAMIN B 12 PO) Take 1 tablet by mouth every morning. (Patient not taking: Reported on 10/02/2023)   diclofenac  Sodium (VOLTAREN ) 1 % GEL Apply 2 g topically 4 (four) times daily. (Patient not taking: Reported on 10/02/2023)   simvastatin  (ZOCOR ) 20 MG tablet Take 1 tablet (20 mg total) by mouth at bedtime.   No facility-administered encounter medications on file as of 10/02/2023.

## 2023-10-02 ENCOUNTER — Encounter: Payer: Self-pay | Admitting: Family Medicine

## 2023-10-02 ENCOUNTER — Ambulatory Visit: Admitting: Family Medicine

## 2023-10-02 VITALS — BP 162/78 | HR 54 | Temp 98.4°F | Ht 66.5 in | Wt 170.0 lb

## 2023-10-02 DIAGNOSIS — I10 Essential (primary) hypertension: Secondary | ICD-10-CM | POA: Diagnosis not present

## 2023-10-02 DIAGNOSIS — E119 Type 2 diabetes mellitus without complications: Secondary | ICD-10-CM

## 2023-10-02 DIAGNOSIS — J301 Allergic rhinitis due to pollen: Secondary | ICD-10-CM | POA: Diagnosis not present

## 2023-10-02 NOTE — Patient Instructions (Addendum)
 Try Flonase over the counter  For two weeks, check your blood pressure twice a day. After 2 weeks, either send me a mychart message or you can also call with those numbers.

## 2023-10-17 ENCOUNTER — Telehealth: Payer: Self-pay | Admitting: Family Medicine

## 2023-10-17 ENCOUNTER — Other Ambulatory Visit: Payer: Self-pay | Admitting: Family Medicine

## 2023-10-17 MED ORDER — METOPROLOL TARTRATE 50 MG PO TABS: 50.0000 mg | ORAL_TABLET | Freq: Two times a day (BID) | ORAL | 1 refills | Status: AC

## 2023-10-17 NOTE — Telephone Encounter (Signed)
 Copied from CRM #[id number]. Topic: General - Other >> Oct 17, 2023  1:37 PM Dedra B wrote: Reason for CRM: Pt wanted to let PCP know that her BP readings have been ranging 126-145/69-77. She said that one time the systolic got up to 151 but came back down later that day.

## 2023-10-17 NOTE — Telephone Encounter (Signed)
 Great!

## 2023-12-04 ENCOUNTER — Other Ambulatory Visit: Payer: Self-pay | Admitting: Family Medicine

## 2023-12-04 MED ORDER — GABAPENTIN 100 MG PO CAPS
200.0000 mg | ORAL_CAPSULE | Freq: Two times a day (BID) | ORAL | 1 refills | Status: AC
Start: 2023-12-04 — End: ?

## 2023-12-04 NOTE — Telephone Encounter (Signed)
 Last office visit 10/02/2023 for HTN/DM/Seasonal Allergic Rhinitis.  Last refilled 06/29/2023 for #360 with 1 refill.  Next appt: CPE 04/01/24.

## 2023-12-13 ENCOUNTER — Other Ambulatory Visit: Payer: Self-pay | Admitting: Family Medicine

## 2023-12-13 MED ORDER — AMLODIPINE BESYLATE 10 MG PO TABS: 10.0000 mg | ORAL_TABLET | Freq: Every day | ORAL | 1 refills | Status: DC

## 2023-12-19 ENCOUNTER — Other Ambulatory Visit: Payer: Self-pay | Admitting: *Deleted

## 2023-12-19 MED ORDER — AMLODIPINE BESYLATE 10 MG PO TABS: 10.0000 mg | ORAL_TABLET | Freq: Every day | ORAL | 1 refills | Status: AC

## 2023-12-19 NOTE — Telephone Encounter (Signed)
 Copied from CRM #[id number]. Topic: Clinical - Prescription Issue >> Dec 19, 2023 11:41 AM Pinkey ORN wrote: Reason for CRM: amLODipine  (NORVASC ) 10 MG tablet >> Dec 19, 2023 11:42 AM Pinkey ORN wrote: Patient states she put in a refill request for amLODipine  but received lisinopril  instead. Please follow up with the patient.   Patient is also requesting that it is called into:  [facility] - [place], [place] - [address]  [address] [place] [address]  Phone: [phone] Fax: [phone]  Hours: Not open 24 hours

## 2023-12-19 NOTE — Telephone Encounter (Signed)
 Amlodipine  was refilled on 12/13/2023 but sent to [facility].  Refill re-sent to [facility].  Tried to call patient to advise her of this but her mailbox was full so I was unable to leave a message.

## 2024-03-25 ENCOUNTER — Other Ambulatory Visit

## 2024-04-01 ENCOUNTER — Encounter: Admitting: Family Medicine
